# Patient Record
Sex: Female | Born: 2005 | Race: Black or African American | Hispanic: No | Marital: Single
Health system: Midwestern US, Community
[De-identification: ages and names within clinical notes are randomized; demographics above are authoritative.]

## PROBLEM LIST (undated history)

## (undated) ENCOUNTER — Inpatient Hospital Stay (HOSPITAL_COMMUNITY): Payer: Self-pay

## (undated) ENCOUNTER — Emergency Department (HOSPITAL_COMMUNITY): Admission: EM

## (undated) DIAGNOSIS — L309 Dermatitis, unspecified: Secondary | ICD-10-CM

---
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
## 2024-01-02 DIAGNOSIS — Z369 Encounter for antenatal screening, unspecified: Secondary | ICD-10-CM | POA: Diagnosis not present

## 2024-01-02 DIAGNOSIS — Z3490 Encounter for supervision of normal pregnancy, unspecified, unspecified trimester: Secondary | ICD-10-CM | POA: Diagnosis not present

## 2024-01-02 DIAGNOSIS — Z3481 Encounter for supervision of other normal pregnancy, first trimester: Secondary | ICD-10-CM | POA: Diagnosis not present

## 2024-01-02 DIAGNOSIS — N898 Other specified noninflammatory disorders of vagina: Secondary | ICD-10-CM | POA: Diagnosis not present

## 2024-01-02 DIAGNOSIS — Z3A12 12 weeks gestation of pregnancy: Secondary | ICD-10-CM | POA: Diagnosis not present

## 2024-01-02 DIAGNOSIS — Z113 Encounter for screening for infections with a predominantly sexual mode of transmission: Secondary | ICD-10-CM | POA: Diagnosis not present

## 2024-01-02 DIAGNOSIS — Z3480 Encounter for supervision of other normal pregnancy, unspecified trimester: Secondary | ICD-10-CM | POA: Diagnosis not present

## 2024-01-02 LAB — OB RESULTS CONSOLE RUBELLA ANTIBODY, IGM: Rubella: IMMUNE

## 2024-01-28 ENCOUNTER — Inpatient Hospital Stay (HOSPITAL_COMMUNITY)
Admission: AD | Admit: 2024-01-28 | Discharge: 2024-01-28 | Disposition: A | Discharge status: Home / Self Care | Attending: Obstetrics and Gynecology | Admitting: Obstetrics and Gynecology

## 2024-01-28 ENCOUNTER — Encounter (HOSPITAL_COMMUNITY): Payer: Self-pay | Admitting: Obstetrics and Gynecology

## 2024-01-28 DIAGNOSIS — X58XXXA Exposure to other specified factors, initial encounter: Secondary | ICD-10-CM | POA: Insufficient documentation

## 2024-01-28 DIAGNOSIS — O9A212 Injury, poisoning and certain other consequences of external causes complicating pregnancy, second trimester: Secondary | ICD-10-CM

## 2024-01-28 DIAGNOSIS — S3991XA Unspecified injury of abdomen, initial encounter: Secondary | ICD-10-CM | POA: Diagnosis not present

## 2024-01-28 DIAGNOSIS — Z3A16 16 weeks gestation of pregnancy: Secondary | ICD-10-CM | POA: Diagnosis not present

## 2024-01-28 DIAGNOSIS — W228XXA Striking against or struck by other objects, initial encounter: Secondary | ICD-10-CM | POA: Diagnosis not present

## 2024-01-28 NOTE — MAU Note (Signed)
 Tami Hall is a 18 y.o. at [redacted]w[redacted]d here in MAU reporting: hit her stomach on a machine at work . Had some pain when it first happened ( ago) but not now. Denies any vag bleefding or discharge. Stated she has a headache as well   LMP:  Onset of complaint: 45 min Pain score: 3 headache Vitals:   01/28/24 1935  BP: (!) 104/55  Pulse: 89  Resp: 18  Temp: 98.2 F (36.8 C)     FHT: 152   Lab orders placed from triage:

## 2024-01-28 NOTE — Discharge Instructions (Signed)
 Reasons to return to MAU at [facility]: If you have heavier bleeding that soaks through more that 2 pads per hour for an hour or more If you bleed so much that you feel like you might pass out or you do pass out If you have significant abdominal pain that is not improved with Tylenol  1000 mg every 8 hours as needed for pain If you develop a fever > 100.5

## 2024-01-28 NOTE — MAU Provider Note (Signed)
 None     [name] is a 18 y.o. G1P0 female at [redacted]w[redacted]d who presents to MAU today reporting that she hit her stomach at work. 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 She had some pain when it first happened but none now. No bruising or open wound. Denies vaginal bleeding or cramping.   Pertinent items noted in HPI and remainder of comprehensive ROS otherwise negative.   O BP (!) 104/55   Pulse 89   Temp 98.2 F (36.8 C)   Resp 18   Ht 5' 3 (1.6 m)   Wt 61.7 kg   LMP 10/06/2023   BMI 24.09 kg/m  Physical Exam Vitals reviewed.  Constitutional:      General: She is not in acute distress.    Appearance: She is well-developed. She is not diaphoretic.  Eyes:     General: No scleral icterus. Pulmonary:     Effort: Pulmonary effort is normal. No respiratory distress.  Abdominal:     Palpations: Abdomen is soft.     Tenderness: There is no abdominal tenderness.  Skin:    General: Skin is warm and dry.  Neurological:     Mental Status: She is alert.     Coordination: Coordination normal.    No results found for this or any previous visit (from the past 24 hours).  MDM: Low MAU Course: -Patient has low BP as baseline. -FHR within normal limits.  A 1. Abdominal trauma, initial encounter (Primary) - Discharge patient  2. [redacted] weeks gestation of pregnancy - Discharge patient   Medical screening exam complete  P Discharge from MAU in stable condition with bleeding precautions Follow up at [facility] as scheduled for ongoing prenatal care  No future appointments. Allergies as of 01/28/2024   No Known Allergies      Medication List     TAKE these medications    hydrocortisone  2.5 % ointment Apply topically 2 (two) times daily. Apply to vaginal area for itching for 3 days only.   loratadine  5 MG/5ML syrup Commonly known as: Claritin  Take 5 mLs (5 mg total) by mouth daily.   ondansetron  4 MG  disintegrating tablet Commonly known as: ZOFRAN -ODT Take 1 tablet (4 mg total) by mouth every 8 (eight) hours as needed for nausea or vomiting.   prenatal vitamin w/FE, FA 27-1 MG Tabs tablet Take 1 tablet by mouth daily at 12 noon.   triamcinolone  0.025 % ointment Commonly known as: KENALOG  Apply topically 2 (two) times daily. Apply bid x 7 days        [name], PA

## 2024-02-15 ENCOUNTER — Telehealth: Payer: Self-pay

## 2024-02-15 DIAGNOSIS — R109 Unspecified abdominal pain: Secondary | ICD-10-CM

## 2024-02-15 NOTE — Progress Notes (Signed)
 Complex Care Management Note Care Guide Note  02/15/2024 Name: Tami Hall MRN: [id number] DOB: 02-08-2006   Complex Care Management Outreach Attempts: An unsuccessful telephone outreach was attempted today to offer the patient information about available complex care management services.  Follow Up Plan:  Additional outreach attempts will be made to offer the patient complex care management information and services.   Encounter Outcome:  No Answer  [name]  [facility], [facility] VBCI Assistant Direct Dial: [phone]  Fax: [phone]

## 2024-02-19 NOTE — Progress Notes (Signed)
 Complex Care Management Note  Care Guide Note 02/19/2024 Name: KENNA SEWARD MRN: [id number] DOB: [birth year]  Theadore GORMAN Metro is a 18 y.o. year old female who sees Howell Lunger, [place] for primary care. I reached out to Theadore GORMAN Metro by phone today to offer complex care management services.  Ms. Vaughn was given information about Complex Care Management services today including:   The Complex Care Management services include support from the care team which includes your Nurse Care Manager, Clinical Social Worker, or Pharmacist.  The Complex Care Management team is here to help remove barriers to the health concerns and goals most important to you. Complex Care Management services are voluntary, and the patient may decline or stop services at any time by request to their care team member.   Complex Care Management Consent Status: Patient did not agree to participate in complex care management services at this time.  Encounter Outcome:  Patient Refused  Dreama Agent [facility], [facility] VBCI Assistant Direct Dial: [phone]  Fax: [phone]

## 2024-02-19 NOTE — Progress Notes (Signed)
 Complex Care Management Note Care Guide Note  02/19/2024 Name: Tami Hall MRN: [id number] DOB: 18-Sep-2005   Complex Care Management Outreach Attempts: A second unsuccessful outreach was attempted today to offer the patient with information about available complex care management services.  Follow Up Plan:  Additional outreach attempts will be made to offer the patient complex care management information and services.   Encounter Outcome:  No Answer  [name] [facility]  [facility], [facility] VBCI Assistant Direct Dial: [phone]  Fax: [phone]

## 2024-02-26 DIAGNOSIS — Z3A20 20 weeks gestation of pregnancy: Secondary | ICD-10-CM | POA: Diagnosis not present

## 2024-02-26 DIAGNOSIS — Z148 Genetic carrier of other disease: Secondary | ICD-10-CM | POA: Diagnosis not present

## 2024-02-26 DIAGNOSIS — Z363 Encounter for antenatal screening for malformations: Secondary | ICD-10-CM | POA: Diagnosis not present

## 2024-03-20 ENCOUNTER — Other Ambulatory Visit: Payer: Self-pay

## 2024-03-20 ENCOUNTER — Emergency Department (HOSPITAL_BASED_OUTPATIENT_CLINIC_OR_DEPARTMENT_OTHER)

## 2024-03-20 ENCOUNTER — Emergency Department (HOSPITAL_BASED_OUTPATIENT_CLINIC_OR_DEPARTMENT_OTHER)
Admission: EM | Admit: 2024-03-20 | Discharge: 2024-03-21 | Disposition: A | Discharge status: Home / Self Care | Attending: Emergency Medicine | Admitting: Emergency Medicine

## 2024-03-20 DIAGNOSIS — Z3A23 23 weeks gestation of pregnancy: Secondary | ICD-10-CM | POA: Diagnosis not present

## 2024-03-20 DIAGNOSIS — O4690 Antepartum hemorrhage, unspecified, unspecified trimester: Secondary | ICD-10-CM

## 2024-03-20 DIAGNOSIS — O4692 Antepartum hemorrhage, unspecified, second trimester: Secondary | ICD-10-CM | POA: Insufficient documentation

## 2024-03-20 DIAGNOSIS — N939 Abnormal uterine and vaginal bleeding, unspecified: Secondary | ICD-10-CM | POA: Insufficient documentation

## 2024-03-20 LAB — CBC WITH DIFFERENTIAL/PLATELET
Abs Immature Granulocytes: 0.02 K/uL (ref 0.00–0.07)
Basophils Absolute: 0 K/uL (ref 0.0–0.1)
Basophils Relative: 0 %
Eosinophils Absolute: 0.1 K/uL (ref 0.0–0.5)
Eosinophils Relative: 1 %
HCT: 30 % — ABNORMAL LOW (ref 36.0–46.0)
Hemoglobin: 11 g/dL — ABNORMAL LOW (ref 12.0–15.0)
Immature Granulocytes: 0 %
Lymphocytes Relative: 24 %
Lymphs Abs: 2 K/uL (ref 0.7–4.0)
MCH: 31.2 pg (ref 26.0–34.0)
MCHC: 36.7 g/dL — ABNORMAL HIGH (ref 30.0–36.0)
MCV: 85 fL (ref 80.0–100.0)
Monocytes Absolute: 0.4 K/uL (ref 0.1–1.0)
Monocytes Relative: 5 %
Neutro Abs: 5.9 K/uL (ref 1.7–7.7)
Neutrophils Relative %: 70 %
Platelets: 388 K/uL (ref 150–400)
RBC: 3.53 MIL/uL — ABNORMAL LOW (ref 3.87–5.11)
RDW: 12.2 % (ref 11.5–15.5)
WBC: 8.5 K/uL (ref 4.0–10.5)
nRBC: 0 % (ref 0.0–0.2)

## 2024-03-20 NOTE — ED Triage Notes (Signed)
 Pt states that she has baan having vaginal bleeding (spotting) for the past 2-3 days. Pt is [redacted] weeks pregnant, G1P0, mother reports normal movement. Pt also report vaginal irritation after intercourse, pt denies abd pain.

## 2024-03-20 NOTE — ED Notes (Addendum)
 Vertell with rapid OB response notified. Pt placed on External fetal monitor. Fetal Heart rate 154. Denies any abd pain.

## 2024-03-21 LAB — WET PREP, GENITAL
Clue Cells Wet Prep HPF POC: NONE SEEN
Sperm: NONE SEEN
Trich, Wet Prep: NONE SEEN
WBC, Wet Prep HPF POC: <10 (ref <10)
Yeast Wet Prep HPF POC: NONE SEEN

## 2024-03-21 LAB — URINALYSIS, ROUTINE W REFLEX MICROSCOPIC
Bilirubin Urine: NEGATIVE
Glucose, UA: NEGATIVE mg/dL
Hgb urine dipstick: NEGATIVE
Ketones, ur: NEGATIVE mg/dL
Nitrite: NEGATIVE
Protein, ur: NEGATIVE mg/dL
Specific Gravity, Urine: 1.016 (ref 1.005–1.030)
pH: 7 (ref 5.0–8.0)

## 2024-03-21 LAB — RH IG WORKUP (INCLUDES ABO/RH)
ABO/RH(D): A POS
Gestational Age(Wks): 23

## 2024-03-21 LAB — HIV ANTIBODY (ROUTINE TESTING W REFLEX): HIV Screen 4th Generation wRfx: NONREACTIVE

## 2024-03-21 LAB — ABO/RH: ABO/RH(D): A POS

## 2024-03-21 NOTE — Progress Notes (Signed)
 Spoke with ED RN. Informed her that Dr MARLA Bring has approved the pt being discharged home.

## 2024-03-21 NOTE — Progress Notes (Signed)
 Spoke with Dr MARLA Bring regarding pt who is a G1P0 at [redacted]w[redacted]d has presented to [facility] ED with c/o vaginal bleeding/spotting x2-3days. +FM, -leaking fluid, -cramping/contrx. FHR 140s, mod variability, appropriate tracing for 23w. ED MD performed speculum exam and saw no bleeding from cervix or in vaginal canal. Pt has not established care at this time, has come to MAU several times. Dr Bring is ok with pt being d/c home at this time.

## 2024-03-21 NOTE — ED Notes (Signed)
 Leaving before receiving DC papers. Refuses vitals. IV removed. Seen leaving to lobby with mother.

## 2024-03-21 NOTE — Discharge Instructions (Signed)
 You were seen today for bleeding during pregnancy.  Given that this is happening after sex and you were told to have bed rest, would avoid sexual contact until cleared by your OB/GYN.  Otherwise your workup was reassuring.

## 2024-03-21 NOTE — ED Provider Notes (Signed)
 [place] EMERGENCY DEPARTMENT AT [facility] Provider Note   CSN: [id number] Arrival date & time: 03/20/24  2004     Patient presents with: Vaginal Bleeding   Tami Hall is a 18 y.o. female.   HPI     This a 18 year old G1, P0 approximately 23 weeks and 5 days pregnant who presents with vaginal spotting.  Patient reports she has had 2 episodes of vaginal spotting in the last 2 to 3 days.  At least one of the episodes was after having sex.  She reports that she is supposed to be on bedrest but was not told that she should not have sex.  She is not reporting any abdominal pain or contractions.  No urinary symptoms but does report some vaginal irritation.  Has not had any complications during this pregnancy.  Prior to Admission medications   Medication Sig Start Date End Date Taking? Authorizing Provider  hydrocortisone  2.5 % ointment Apply topically 2 (two) times daily. Apply to vaginal area for itching for 3 days only. 01/09/19   Shirley, [place], DO  loratadine  (CLARITIN ) 5 MG/5ML syrup Take 5 mLs (5 mg total) by mouth daily. Patient not taking: Reported on 01/28/2024 05/11/11   [place], Sarah T, MD  ondansetron  (ZOFRAN -ODT) 4 MG disintegrating tablet Take 1 tablet (4 mg total) by mouth every 8 (eight) hours as needed for nausea or vomiting. Patient not taking: Reported on 01/28/2024 11/21/23   Jhonny Augustin BROCKS, MD  prenatal vitamin w/FE, FA (PRENATAL 1 + 1) 27-1 MG TABS tablet Take 1 tablet by mouth daily at 12 noon. 11/21/23   Jhonny Augustin BROCKS, MD  triamcinolone  (KENALOG ) 0.025 % ointment Apply topically 2 (two) times daily. Apply bid x 7 days 03/07/12   Penne Noe, MD    Allergies: Patient has no known allergies.    Review of Systems  Constitutional:  Negative for fever.  Gastrointestinal:  Negative for abdominal pain.  Genitourinary:  Positive for vaginal bleeding.  All other systems reviewed and are negative.   Updated Vital Signs BP 120/72   Pulse 80   Temp 98.3  F (36.8 C) (Oral)   Resp 18   LMP 10/06/2023   SpO2 100%   Physical Exam Vitals and nursing note reviewed. Exam conducted with a chaperone present.  Constitutional:      Appearance: She is well-developed.  HENT:     Head: Normocephalic and atraumatic.  Eyes:     Pupils: Pupils are equal, round, and reactive to light.  Cardiovascular:     Rate and Rhythm: Normal rate and regular rhythm.     Heart sounds: Normal heart sounds.  Pulmonary:     Effort: Pulmonary effort is normal. No respiratory distress.     Breath sounds: No wheezing.  Abdominal:     General: Bowel sounds are normal.     Palpations: Abdomen is soft.     Tenderness: There is no abdominal tenderness. There is no guarding or rebound.     Comments: Gravid  Genitourinary:    Comments: Normal external vaginal exam, moderate vaginal discharge noted, no active bleeding, cervical os closed Musculoskeletal:     Cervical back: Neck supple.  Skin:    General: Skin is warm and dry.  Neurological:     Mental Status: She is alert and oriented to person, place, and time.  Psychiatric:        Mood and Affect: Mood normal.     (all labs ordered are listed, but only abnormal results  are displayed) Labs Reviewed  CBC WITH DIFFERENTIAL/PLATELET - Abnormal; Notable for the following components:      Result Value   RBC 3.53 (*)    Hemoglobin 11.0 (*)    HCT 30.0 (*)    MCHC 36.7 (*)    All other components within normal limits  URINALYSIS, ROUTINE W REFLEX MICROSCOPIC - Abnormal; Notable for the following components:   APPearance HAZY (*)    Leukocytes,Ua SMALL (*)    Bacteria, UA RARE (*)    All other components within normal limits  WET PREP, GENITAL  HIV ANTIBODY (ROUTINE TESTING W REFLEX)  ABO/RH  RH IG WORKUP (INCLUDES ABO/RH)  GC/CHLAMYDIA PROBE AMP ([place]) NOT AT [facility]    EKG: None  Radiology: US  OB Limited > 14 wks Result Date: 03/20/2024 EXAM: ULTRASOND OB LIMITED CLINICAL HISTORY: vaginal  bleedinhg. TECHNIQUE: Transabdominal/Transvaginal obstetric pelvic ultrasound was performed with color Doppler flow evaluation. COMPARISON: None . FINDINGS: FETUS: A single live intrauterine pregnancy is present, estimated gestational age [redacted] weeks and 1 day which is based only on a femur length measurement. Fetal movement is confirmed. POSITION: Presentation is cephalic. HEART RATE: Fetal heart rate measures 138  beats per minute. PLACENTA: The placenta is located posteriorly without evidence of previa. There is no retroplacental hematoma. AMNIOTIC FLUID: Amniotic fluid volume appears visually normal, but the technologist did not take measurements. Amniotic fluid index not obtained. BPD was not measured. Head circumference was not measured. Abdominal circumference was not measured. Femur length measures 4.57 cm. IMPRESSION: 1. Single live intrauterine pregnancy with gestational age of [redacted] weeks and 1 day by femur length single measurement. By comparison, the lmp ega is 23 weeks 5 days, lmp edc 07/12/2024 . 2. Cephalic presentation. 3. Posterior placenta without evidence of previa or retroplacental hematoma. 4. Amniotic fluid volume appears visually normal; measurements were not obtained. Limited exam. . Electronically signed by: Francis Quam MD 03/20/2024 10:15 PM EDT RP Workstation: [id number]     Procedures   Medications Ordered in the ED - No data to display                                  Medical Decision Making Amount and/or Complexity of Data Reviewed Labs: ordered.   This patient presents to the ED for concern of vaginal bleeding during pregnancy, this involves an extensive number of treatment options, and is a complaint that carries with it a high risk of complications and morbidity.  I considered the following differential and admission for this acute, potentially life threatening condition.  The differential diagnosis includes bleeding secondary to intercourse, subchorionic hemorrhage,  placental abruption, miscarriage, placenta previa  MDM:    This is a 18 year old female who presents with vaginal bleeding.  At least 1 of these episodes was after intercourse.  She is not actively bleeding at this time.  Ultrasound is reassuring without previa.  Good fetal heart tones and no active contractions on monitoring.  Pelvic exam is reassuring without any active bleeding.  Urinalysis and wet prep are also reassuring.  Advised patient that if she was advised by her OB/GYN to have bed rest, she should not be engaging in sexual activity until cleared by her OB/GYN.  She stated understanding.  Patient is A+ and does not need RhoGAM.  (Labs, imaging, consults)  Labs: I Ordered, and personally interpreted labs.  The pertinent results include: CBC, Rh, urinalysis, wet prep  Imaging Studies  ordered: I ordered imaging studies including ultrasound I independently visualized and interpreted imaging. I agree with the radiologist interpretation  Additional history obtained from chart review.  External records from outside source obtained and reviewed including prior evaluations  Cardiac Monitoring: The patient was maintained on a cardiac monitor.  If on the cardiac monitor, I personally viewed and interpreted the cardiac monitored which showed an underlying rhythm of: Sinus  Reevaluation: After the interventions noted above, I reevaluated the patient and found that they have :improved  Social Determinants of Health:  lives independently  Disposition: Discharge  Co morbidities that complicate the patient evaluation  Past Medical History:  Diagnosis Date   Eczema      Medicines No orders of the defined types were placed in this encounter.   I have reviewed the patients home medicines and have made adjustments as needed  Problem List / ED Course: Problem List Items Addressed This Visit   None Visit Diagnoses       Vaginal bleeding during pregnancy    -  Primary                 Final diagnoses:  Vaginal bleeding during pregnancy    ED Discharge Orders     None          Bari Charmaine FALCON, MD 03/21/24 [phone]

## 2024-03-24 LAB — GC/CHLAMYDIA PROBE AMP (~~LOC~~) NOT AT ARMC
Chlamydia: NEGATIVE
Comment: NEGATIVE
Comment: NORMAL
Neisseria Gonorrhea: NEGATIVE

## 2024-04-14 ENCOUNTER — Inpatient Hospital Stay (HOSPITAL_COMMUNITY)
Admission: AD | Admit: 2024-04-14 | Discharge: 2024-04-14 | Disposition: A | Discharge status: Home / Self Care | Attending: Family Medicine | Admitting: Family Medicine

## 2024-04-14 ENCOUNTER — Encounter (HOSPITAL_COMMUNITY): Payer: Self-pay | Admitting: Obstetrics and Gynecology

## 2024-04-14 DIAGNOSIS — Z202 Contact with and (suspected) exposure to infections with a predominantly sexual mode of transmission: Secondary | ICD-10-CM | POA: Diagnosis not present

## 2024-04-14 DIAGNOSIS — N898 Other specified noninflammatory disorders of vagina: Secondary | ICD-10-CM | POA: Diagnosis not present

## 2024-04-14 DIAGNOSIS — Z3A27 27 weeks gestation of pregnancy: Secondary | ICD-10-CM

## 2024-04-14 DIAGNOSIS — Z113 Encounter for screening for infections with a predominantly sexual mode of transmission: Secondary | ICD-10-CM | POA: Diagnosis present

## 2024-04-14 DIAGNOSIS — L292 Pruritus vulvae: Secondary | ICD-10-CM | POA: Diagnosis not present

## 2024-04-14 DIAGNOSIS — O26892 Other specified pregnancy related conditions, second trimester: Secondary | ICD-10-CM | POA: Diagnosis not present

## 2024-04-14 LAB — URINALYSIS, ROUTINE W REFLEX MICROSCOPIC
Bilirubin Urine: NEGATIVE
Glucose, UA: NEGATIVE mg/dL
Hgb urine dipstick: NEGATIVE
Ketones, ur: 5 mg/dL — AB
Nitrite: NEGATIVE
Protein, ur: NEGATIVE mg/dL
Specific Gravity, Urine: 1.019 (ref 1.005–1.030)
pH: 7 (ref 5.0–8.0)

## 2024-04-14 LAB — WET PREP, GENITAL
Clue Cells Wet Prep HPF POC: NONE SEEN
Sperm: NONE SEEN
Trich, Wet Prep: NONE SEEN
WBC, Wet Prep HPF POC: <10 (ref <10)

## 2024-04-14 LAB — HEPATITIS B SURFACE ANTIGEN: Hepatitis B Surface Ag: NONREACTIVE

## 2024-04-14 LAB — RAPID HIV SCREEN (HIV 1/2 AB+AG)
HIV 1/2 Antibodies: NONREACTIVE
HIV-1 P24 Antigen - HIV24: NONREACTIVE

## 2024-04-14 NOTE — MAU Provider Note (Signed)
 History     [id number]  Arrival date and time: 04/14/24 1533    Chief Complaint  Patient presents with   Vaginal Itching     HPI Tami Hall is a 18 y.o. at [redacted]w[redacted]d by LMP, who presents for vaginal itching.   Reports vaginal itching that started last night Having some whitish discharge No burning or pain with urination No vaginal bleeding Fetal movement is normal No contractions No leaking fluid Found out partner has been with someone else Wants to get STI testing No rash on labia that she can see  Going to [facility], has had one visit so far   --/--/A POS, A POS (10/16 2042)  OB History     Gravida  1   Para      Term      Preterm      AB      Living         SAB      IAB      Ectopic      Multiple      Live Births              Past Medical History:  Diagnosis Date   Eczema     Past Surgical History:  Procedure Laterality Date   MOUTH SURGERY     dental    Family History  Problem Relation Age of Onset   Healthy Mother    Healthy Father     Social History   Socioeconomic History   Marital status: Single    Spouse name: Not on file   Number of children: Not on file   Years of education: Not on file   Highest education level: Not on file  Occupational History   Not on file  Tobacco Use   Smoking status: Never   Smokeless tobacco: Never   Tobacco comments:    Stopped vaping end of May  Vaping Use   Vaping status: Former  Substance and Sexual Activity   Alcohol use: Not Currently   Drug use: Not Currently    Frequency: 7.0 times per week    Types: Marijuana    Comment: last 5/31   Sexual activity: Yes  Other Topics Concern   Not on file  Social History Narrative   Not on file   Social Drivers of Health   Financial Resource Strain: Not on file  Food Insecurity: Not on file  Transportation Needs: Not on file  Physical Activity: Not on file  Stress: Not on file  Social Connections: Not on file  Intimate  Partner Violence: Not on file    No Known Allergies  No current facility-administered medications on file prior to encounter.   Current Outpatient Medications on File Prior to Encounter  Medication Sig Dispense Refill   prenatal vitamin w/FE, FA (PRENATAL 1 + 1) 27-1 MG TABS tablet Take 1 tablet by mouth daily at 12 noon. 30 tablet 0   hydrocortisone  2.5 % ointment Apply topically 2 (two) times daily. Apply to vaginal area for itching for 3 days only. 20 g 0   loratadine  (CLARITIN ) 5 MG/5ML syrup Take 5 mLs (5 mg total) by mouth daily. (Patient not taking: Reported on 01/28/2024) 236 mL 2   ondansetron  (ZOFRAN -ODT) 4 MG disintegrating tablet Take 1 tablet (4 mg total) by mouth every 8 (eight) hours as needed for nausea or vomiting. (Patient not taking: Reported on 01/28/2024) 15 tablet 0   triamcinolone  (KENALOG ) 0.025 % ointment Apply topically 2 (  two) times daily. Apply bid x 7 days 30 g 0     ROS Pertinent positives and negative per HPI, all others reviewed and negative  Physical Exam   BP (!) 118/56 (BP Location: Right Arm)   Pulse 89   Temp 98.4 F (36.9 C) (Oral)   Resp 15   Ht 5' 2 (1.575 m)   Wt 68.4 kg   LMP 10/06/2023   SpO2 99%   BMI 27.58 kg/m   Patient Vitals for the past 24 hrs:  BP Temp Temp src Pulse Resp SpO2 Height Weight  04/14/24 1605 -- -- -- -- -- 99 % -- --  04/14/24 1604 (!) 118/56 98.4 F (36.9 C) Oral 89 15 99 % 5' 2 (1.575 m) 68.4 kg    Physical Exam Vitals reviewed.  Constitutional:      General: She is not in acute distress.    Appearance: She is well-developed. She is not diaphoretic.  Eyes:     General: No scleral icterus. Pulmonary:     Effort: Pulmonary effort is normal. No respiratory distress.  Skin:    General: Skin is warm and dry.  Neurological:     Mental Status: She is alert.     Coordination: Coordination normal.      Cervical Exam    Bedside Ultrasound Not performed.  My interpretation: n/a  FHT Baseline: 140  bpm Variability: Good {> 6 bpm) Accelerations: Reactive (10x10, appropriate for gestational age) Decelerations: Absent Uterine activity: None Cat: I  Labs Results for orders placed or performed during the hospital encounter of 04/14/24 (from the past 24 hours)  Urinalysis, Routine w reflex microscopic -Urine, Clean Catch     Status: Abnormal   Collection Time: 04/14/24  4:00 PM  Result Value Ref Range   Color, Urine YELLOW YELLOW   APPearance CLOUDY (A) CLEAR   Specific Gravity, Urine 1.019 1.005 - 1.030   pH 7.0 5.0 - 8.0   Glucose, UA NEGATIVE NEGATIVE mg/dL   Hgb urine dipstick NEGATIVE NEGATIVE   Bilirubin Urine NEGATIVE NEGATIVE   Ketones, ur 5 (A) NEGATIVE mg/dL   Protein, ur NEGATIVE NEGATIVE mg/dL   Nitrite NEGATIVE NEGATIVE   Leukocytes,Ua MODERATE (A) NEGATIVE   RBC / HPF 6-10 0 - 5 RBC/hpf   WBC, UA 6-10 0 - 5 WBC/hpf   Bacteria, UA RARE (A) NONE SEEN   Squamous Epithelial / HPF 21-50 0 - 5 /HPF   Mucus PRESENT     Imaging No results found.  MAU Course  Procedures Lab Orders         Wet prep, genital         Urinalysis, Routine w reflex microscopic -Urine, Clean Catch         Rapid HIV screen (HIV 1/2 Ab+Ag)         RPR         Hepatitis B surface antigen         HCV Ab w Reflex to Quant PCR    No orders of the defined types were placed in this encounter.  Imaging Orders  No imaging studies ordered today    MDM Moderate (Level 3-4)  Assessment and Plan  #Vaginal itching #Concern for STI exposure Patient without any other concerns. Offered to collect swabs to rule out HSV, prefers to do so herself and have results communicated to her by MyChart. Swabs and serologies collected.   #FWB FHT Cat I NST: Reactive   Dispo: discharged to home in stable  condition    Donnice CHRISTELLA Carolus, MD/MPH 04/14/24 4:51 PM  Allergies as of 04/14/2024   No Known Allergies      Medication List     TAKE these medications    hydrocortisone  2.5 %  ointment Apply topically 2 (two) times daily. Apply to vaginal area for itching for 3 days only.   loratadine  5 MG/5ML syrup Commonly known as: Claritin  Take 5 mLs (5 mg total) by mouth daily.   ondansetron  4 MG disintegrating tablet Commonly known as: ZOFRAN -ODT Take 1 tablet (4 mg total) by mouth every 8 (eight) hours as needed for nausea or vomiting.   prenatal vitamin w/FE, FA 27-1 MG Tabs tablet Take 1 tablet by mouth daily at 12 noon.   triamcinolone  0.025 % ointment Commonly known as: KENALOG  Apply topically 2 (two) times daily. Apply bid x 7 days

## 2024-04-14 NOTE — MAU Note (Signed)
 Tami Hall is a 18 y.o. at [redacted]w[redacted]d here in MAU reporting: vaginal itching and burning since last pm. Reports she had intercourse with the baby's daddy on Friday and found out yesterday he was having sex with someone else. Also reports a white discharge. Denies pain, bleeding or ROM. Reports positive fetal movement.   Onset of complaint: last night Pain score: 0 Vitals:   04/14/24 1604 04/14/24 1605  BP: (!) 118/56   Pulse: 89   Resp: 15   Temp: 98.4 F (36.9 C)   SpO2: 99% 99%     FHT: 142  Lab orders placed from triage: ua

## 2024-04-14 NOTE — Discharge Instructions (Signed)
 You were seen in the MAU for vaginal itching. We did swabs and blood work, and per your preference we will let you know the results through MyChart and send you medicine if needed.

## 2024-04-15 LAB — RPR: RPR Ser Ql: NONREACTIVE

## 2024-04-16 ENCOUNTER — Encounter: Payer: Self-pay | Admitting: Student

## 2024-04-16 ENCOUNTER — Ambulatory Visit: Payer: Self-pay | Admitting: Family Medicine

## 2024-04-16 LAB — HCV AB W REFLEX TO QUANT PCR: HCV Ab: NONREACTIVE

## 2024-04-16 LAB — HCV INTERPRETATION

## 2024-04-16 MED ORDER — FLUCONAZOLE 150 MG PO TABS: 150.0000 mg | ORAL_TABLET | Freq: Once | ORAL | 0 refills | Status: AC

## 2024-04-27 ENCOUNTER — Encounter (HOSPITAL_COMMUNITY): Payer: Self-pay | Admitting: Obstetrics and Gynecology

## 2024-04-27 ENCOUNTER — Inpatient Hospital Stay (HOSPITAL_COMMUNITY)
Admission: AD | Admit: 2024-04-27 | Discharge: 2024-04-27 | Disposition: A | Discharge status: Home / Self Care | Attending: Obstetrics and Gynecology | Admitting: Obstetrics and Gynecology

## 2024-04-27 DIAGNOSIS — B3731 Acute candidiasis of vulva and vagina: Secondary | ICD-10-CM | POA: Diagnosis not present

## 2024-04-27 DIAGNOSIS — O23599 Infection of other part of genital tract in pregnancy, unspecified trimester: Secondary | ICD-10-CM

## 2024-04-27 DIAGNOSIS — Z3A29 29 weeks gestation of pregnancy: Secondary | ICD-10-CM | POA: Insufficient documentation

## 2024-04-27 DIAGNOSIS — O98813 Other maternal infectious and parasitic diseases complicating pregnancy, third trimester: Secondary | ICD-10-CM | POA: Insufficient documentation

## 2024-04-27 DIAGNOSIS — R300 Dysuria: Secondary | ICD-10-CM | POA: Insufficient documentation

## 2024-04-27 DIAGNOSIS — O26893 Other specified pregnancy related conditions, third trimester: Secondary | ICD-10-CM | POA: Insufficient documentation

## 2024-04-27 DIAGNOSIS — O23593 Infection of other part of genital tract in pregnancy, third trimester: Secondary | ICD-10-CM | POA: Diagnosis not present

## 2024-04-27 LAB — URINALYSIS, ROUTINE W REFLEX MICROSCOPIC
Bacteria, UA: NONE SEEN
Bilirubin Urine: NEGATIVE
Glucose, UA: 50 mg/dL — AB
Hgb urine dipstick: NEGATIVE
Ketones, ur: NEGATIVE mg/dL
Leukocytes,Ua: NEGATIVE
Nitrite: NEGATIVE
Protein, ur: 30 mg/dL — AB
Specific Gravity, Urine: 1.02 (ref 1.005–1.030)
pH: 7 (ref 5.0–8.0)

## 2024-04-27 LAB — WET PREP, GENITAL
Clue Cells Wet Prep HPF POC: NONE SEEN
Sperm: NONE SEEN
Trich, Wet Prep: NONE SEEN
WBC, Wet Prep HPF POC: >=10 — AB (ref <10)
Yeast Wet Prep HPF POC: NONE SEEN

## 2024-04-27 MED ORDER — TERCONAZOLE 0.8 % VA CREA: 1.0000 | TOPICAL_CREAM | Freq: Every day | VAGINAL | 0 refills | Status: AC

## 2024-04-27 NOTE — MAU Provider Note (Signed)
 Chief Complaint:  vaginal discomfort   HPI   None     Tami Hall is a 18 y.o. G1P0 at [redacted]w[redacted]d who presents to maternity admissions reporting vaginal discomfort. She reports that she finished a 3 day course of vaginal cream for a yeast infection but thinks that the infection did not resolve. She reports dysuria, foul smelling discharge, thick white vaginal discharge. She also reports pelvic pressure earlier today that has since resolved. Last intercourse was yesterday. Denies vaginal bleeding, leaking of fluid, contractions. Endorses good fetal movement.   Pregnancy Course: Receives care at [facility]. Prenatal records reviewed.   Past Medical History:  Diagnosis Date   Eczema    OB History  Gravida Para Term Preterm AB Living  1       SAB IAB Ectopic Multiple Live Births          # Outcome Date GA Lbr Len/2nd Weight Sex Type Anes PTL Lv  1 Current            Past Surgical History:  Procedure Laterality Date   MOUTH SURGERY     dental   Family History  Problem Relation Age of Onset   Healthy Mother    Healthy Father    Social History   Tobacco Use   Smoking status: Never   Smokeless tobacco: Never   Tobacco comments:    Stopped vaping end of May  Vaping Use   Vaping status: Former  Substance Use Topics   Alcohol use: Not Currently   Drug use: Not Currently    Frequency: 7.0 times per week    Types: Marijuana    Comment: last 5/31   No Known Allergies Medications Prior to Admission  Medication Sig Dispense Refill Last Dose/Taking   loratadine  (CLARITIN ) 5 MG/5ML syrup Take 5 mLs (5 mg total) by mouth daily. 236 mL 2 Past Month   ondansetron  (ZOFRAN -ODT) 4 MG disintegrating tablet Take 1 tablet (4 mg total) by mouth every 8 (eight) hours as needed for nausea or vomiting. 15 tablet 0 Past Month   prenatal vitamin w/FE, FA (PRENATAL 1 + 1) 27-1 MG TABS tablet Take 1 tablet by mouth daily at 12 noon. 30 tablet 0 04/26/2024   [DISCONTINUED] terconazole   (TERAZOL 3 ) 0.8 % vaginal cream Place 1 applicator vaginally at bedtime.   Past Month   hydrocortisone  2.5 % ointment Apply topically 2 (two) times daily. Apply to vaginal area for itching for 3 days only. 20 g 0    triamcinolone  (KENALOG ) 0.025 % ointment Apply topically 2 (two) times daily. Apply bid x 7 days 30 g 0     I have reviewed patient's Past Medical Hx, Surgical Hx, Family Hx, Social Hx, medications and allergies.   ROS  Pertinent items noted in HPI and remainder of comprehensive ROS otherwise negative.   PHYSICAL EXAM  Patient Vitals for the past 24 hrs:  BP Temp Temp src Pulse Resp SpO2 Height Weight  04/27/24 1506 -- -- -- -- -- 98 % -- --  04/27/24 1501 111/74 -- -- (!) 103 17 99 % -- --  04/27/24 1453 110/68 98 F (36.7 C) Oral 89 18 -- -- --  04/27/24 1409 115/74 97.8 F (36.6 C) Oral (!) 105 16 98 % 5' 2 (1.575 m) 71.2 kg    Constitutional: Well-developed, well-nourished female in no acute distress.  HEENT: atraumatic, normocephalic. Neck has normal ROM. EOM intact. Cardiovascular: normal rate & rhythm, warm and well-perfused Respiratory: normal effort, no  problems with respiration noted GI: gravid abdomen MSK: Extremities nontender, no edema, normal ROM Skin: warm and dry. Acyanotic, no jaundice or pallor. Neurologic: Alert and oriented x 4. No abnormal coordination. Psychiatric: Normal mood. Speech not slurred, not rapid/pressured. Patient is cooperative. Pelvic exam: deferred  Fetal Tracing: Baseline FHR: 135 per minute Fetal heart variability: moderate Fetal Heart Rate accelerations: yes Fetal Heart Rate decelerations: none Fetal Non-stress Test: Category I (reactive) Toco: uterine irritability   Labs: Results for orders placed or performed during the hospital encounter of 04/27/24 (from the past 24 hours)  Wet prep, genital     Status: Abnormal   Collection Time: 04/27/24  2:42 PM   Specimen: Urine, Clean Catch  Result Value Ref Range   Yeast Wet  Prep HPF POC NONE SEEN NONE SEEN   Trich, Wet Prep NONE SEEN NONE SEEN   Clue Cells Wet Prep HPF POC NONE SEEN NONE SEEN   WBC, Wet Prep HPF POC >=10 (A) <10   Sperm NONE SEEN   Urinalysis, Routine w reflex microscopic -Urine, Clean Catch     Status: Abnormal   Collection Time: 04/27/24  2:42 PM  Result Value Ref Range   Color, Urine YELLOW YELLOW   APPearance HAZY (A) CLEAR   Specific Gravity, Urine 1.020 1.005 - 1.030   pH 7.0 5.0 - 8.0   Glucose, UA 50 (A) NEGATIVE mg/dL   Hgb urine dipstick NEGATIVE NEGATIVE   Bilirubin Urine NEGATIVE NEGATIVE   Ketones, ur NEGATIVE NEGATIVE mg/dL   Protein, ur 30 (A) NEGATIVE mg/dL   Nitrite NEGATIVE NEGATIVE   Leukocytes,Ua NEGATIVE NEGATIVE   RBC / HPF 0-5 0 - 5 RBC/hpf   WBC, UA 0-5 0 - 5 WBC/hpf   Bacteria, UA NONE SEEN NONE SEEN   Squamous Epithelial / HPF 0-5 0 - 5 /HPF   Mucus PRESENT    Amorphous Crystal PRESENT     Imaging:  No results found.  MDM & MAU COURSE  MDM: Moderate  MAU Course: -Vital signs within normal limits. -UA, GC/CT, and wet prep to rule out infection.  -UA negative for infection. -Wet prep negative. Recommend 7 days of terconazole  cream to treat infection based on clinical diagnosis.  Differential diagnosis considered for vaginal discharge includes but is not limited to: bacterial vaginosis, vulvovaginal candidiasis, trichomoniasis, gonorrhea, chlamydia, PID   Orders Placed This Encounter  Procedures   Wet prep, genital   Urinalysis, Routine w reflex microscopic -Urine, Clean Catch   Discharge patient   Meds ordered this encounter  Medications   terconazole  (TERAZOL 3 ) 0.8 % vaginal cream    Sig: Place 1 applicator vaginally at bedtime for 7 days.    Dispense:  20 g    Refill:  0    ASSESSMENT   1. Candidal vulvovaginitis during pregnancy   2. [redacted] weeks gestation of pregnancy     PLAN  Discharge home in stable condition with preterm labor precautions.  Recommend 7 day course of terconazole   cream.     Allergies as of 04/27/2024   No Known Allergies      Medication List     TAKE these medications    hydrocortisone  2.5 % ointment Apply topically 2 (two) times daily. Apply to vaginal area for itching for 3 days only.   loratadine  5 MG/5ML syrup Commonly known as: Claritin  Take 5 mLs (5 mg total) by mouth daily.   ondansetron  4 MG disintegrating tablet Commonly known as: ZOFRAN -ODT Take 1 tablet (4 mg total)  by mouth every 8 (eight) hours as needed for nausea or vomiting.   prenatal vitamin w/FE, FA 27-1 MG Tabs tablet Take 1 tablet by mouth daily at 12 noon.   terconazole  0.8 % vaginal cream Commonly known as: TERAZOL 3  Place 1 applicator vaginally at bedtime for 7 days.   triamcinolone  0.025 % ointment Commonly known as: KENALOG  Apply topically 2 (two) times daily. Apply bid x 7 days        [name], PA

## 2024-04-27 NOTE — Discharge Instructions (Signed)
 YEAST INFECTION (CANDIDIASIS): Use the terconazole  cream nightly at bedtime for 7 nights. Keep the area cool and dry as much as possible. Use gentle nonscented soap when bathing. Coconut oil has some antifungal properties that may offer some relief. Avoid foods that are high in sugar. Eat more yogurt or have a probiotic. Wear natural, breathable fibers such as cotton, silk or linen. Wear loose fitting clothing. If symptoms do not resolve, follow up with your OB.  Reasons to return to [facility]U at [facility]: Less than 36 weeks: Contractions feels like menstrual cramps. You should go to the hospital if you have more than 6 contractions in an hour, even after you have rested and drank at least 16 ounces of water.  More than 36 weeks: You begin to have strong, frequent contractions 5 minutes apart or less, each last 1 minute, these have been going on for 1-2 hours, and you cannot walk or talk during them. Your water breaks.  Sometimes it is a big gush of fluid. However, many times it may it may be much more subtle. You should go to the hospital if you have a constant leakage of fluid from your vagina, enough to soak a pad when you are walking around.  You have vaginal bleeding.  It is normal to have a small amount of spotting if your cervix was checked. If you have bleeding requiring the use of a pad, go to the hospital. You don't feel your baby moving like normal.  If you think that you baby's movement is decreased, eat a snack and rest on your left side in a quiet room for one hour. If you have not felt the baby move more than 6 times in an hour GO TO THE HOSPITAL.

## 2024-04-27 NOTE — MAU Note (Signed)
 Tami Hall is a 18 y.o. at [redacted]w[redacted]d here in MAU reporting: wants to make sure the baby isn't coming today because she is  feeling weird down there  Completed her 3 day course of vaginal cream for the yeast infection, but thinks that the yeast infection didn't go away. States it is burning when she pees, there is foul smelling discharge, and is feeling pelvic pressure. Had intercourse yesterday. White thick vaginal discharge  Denies any CTXs, LOF or VB. Reports +FM  Onset of complaint: ongoing Pain score: 2 Vitals:   04/27/24 1409  BP: 115/74  Pulse: (!) 105  Resp: 16  Temp: 97.8 F (36.6 C)  SpO2: 98%     FHT:151 Lab orders placed from triage:  vag swab, UA

## 2024-05-06 DIAGNOSIS — O09619 Supervision of young primigravida, unspecified trimester: Secondary | ICD-10-CM | POA: Diagnosis not present

## 2024-05-06 DIAGNOSIS — Z364 Encounter for antenatal screening for fetal growth retardation: Secondary | ICD-10-CM | POA: Diagnosis not present

## 2024-05-06 DIAGNOSIS — Z3A30 30 weeks gestation of pregnancy: Secondary | ICD-10-CM | POA: Diagnosis not present

## 2024-05-19 DIAGNOSIS — Z3A32 32 weeks gestation of pregnancy: Secondary | ICD-10-CM | POA: Diagnosis not present

## 2024-05-27 LAB — GC/CHLAMYDIA PROBE AMP (~~LOC~~) NOT AT ARMC
Chlamydia: NEGATIVE
Comment: NEGATIVE
Comment: NORMAL
Neisseria Gonorrhea: NEGATIVE

## 2024-06-05 NOTE — L&D Delivery Note (Signed)
 Operative Delivery Note At 3:59 AM a stunned  female was delivered via Vaginal, Spontaneous.  Presentation: vertex; Position: Right,, Occiput,, Anterior; Station: +5.  Delivery of the head: without difficulty.  Nuchal cord times one and was tight. I was not able to reduce it. Thecord was clamped and cut on the perineum    Shoulder dystocia for 10 seconds reduced with Johnita Rummer.  Body delivered and heart rate low.  Infant suctioned.  NICU called.  CPR started.  Infant was revived with supportive measures by NICU team .    ,   Second maneuver: ,   Third maneuver: ,   Fourth maneuver: ,   Fifth maneuver: ,   Sixth maneuver: ,    Verbal consent: obtained from patient.  APGAR: 0, 6; weight  .   Placenta status: , to pathology .   Cord:  with the following complications: .  Cord pH: 7.33   Anesthesia:   Episiotomy: None Lacerations: 1st degree;Perineal Suture Repair: 2.0 chromic Est. Blood Loss (mL): 213  Mom to postpartum.  Baby to Couplet care / Skin to Skin.  Ilyssa Grennan A Roderic Lammert 07/07/2024, 5:06 AM

## 2024-06-16 LAB — OB RESULTS CONSOLE GBS: GBS: NEGATIVE

## 2024-06-20 ENCOUNTER — Encounter (HOSPITAL_COMMUNITY): Payer: Self-pay | Admitting: Obstetrics and Gynecology

## 2024-06-20 ENCOUNTER — Inpatient Hospital Stay (HOSPITAL_COMMUNITY)
Admission: AD | Admit: 2024-06-20 | Discharge: 2024-06-21 | Disposition: A | Discharge status: Home / Self Care | Attending: Obstetrics and Gynecology | Admitting: Obstetrics and Gynecology

## 2024-06-20 ENCOUNTER — Other Ambulatory Visit: Payer: Self-pay

## 2024-06-20 ENCOUNTER — Inpatient Hospital Stay (HOSPITAL_COMMUNITY)

## 2024-06-20 DIAGNOSIS — O26893 Other specified pregnancy related conditions, third trimester: Secondary | ICD-10-CM | POA: Diagnosis present

## 2024-06-20 DIAGNOSIS — Z3689 Encounter for other specified antenatal screening: Secondary | ICD-10-CM

## 2024-06-20 DIAGNOSIS — O4703 False labor before 37 completed weeks of gestation, third trimester: Secondary | ICD-10-CM | POA: Diagnosis not present

## 2024-06-20 DIAGNOSIS — O368330 Maternal care for abnormalities of the fetal heart rate or rhythm, third trimester, not applicable or unspecified: Secondary | ICD-10-CM | POA: Diagnosis not present

## 2024-06-20 DIAGNOSIS — K668 Other specified disorders of peritoneum: Secondary | ICD-10-CM | POA: Insufficient documentation

## 2024-06-20 DIAGNOSIS — R52 Pain, unspecified: Secondary | ICD-10-CM | POA: Diagnosis present

## 2024-06-20 DIAGNOSIS — Z3A36 36 weeks gestation of pregnancy: Secondary | ICD-10-CM | POA: Insufficient documentation

## 2024-06-20 DIAGNOSIS — O479 False labor, unspecified: Secondary | ICD-10-CM | POA: Diagnosis not present

## 2024-06-20 DIAGNOSIS — Z9289 Personal history of other medical treatment: Secondary | ICD-10-CM | POA: Diagnosis not present

## 2024-06-20 LAB — CBC
HCT: 33 % — ABNORMAL LOW (ref 36.0–46.0)
Hemoglobin: 11.9 g/dL — ABNORMAL LOW (ref 12.0–15.0)
MCH: 28.5 pg (ref 26.0–34.0)
MCHC: 36.1 g/dL — ABNORMAL HIGH (ref 30.0–36.0)
MCV: 78.9 fL — ABNORMAL LOW (ref 80.0–100.0)
Platelets: 425 K/uL — ABNORMAL HIGH (ref 150–400)
RBC: 4.18 MIL/uL (ref 3.87–5.11)
RDW: 13.4 % (ref 11.5–15.5)
WBC: 8.4 K/uL (ref 4.0–10.5)
nRBC: 0 % (ref 0.0–0.2)

## 2024-06-20 LAB — URINALYSIS, ROUTINE W REFLEX MICROSCOPIC
Bilirubin Urine: NEGATIVE
Glucose, UA: NEGATIVE mg/dL
Hgb urine dipstick: NEGATIVE
Ketones, ur: NEGATIVE mg/dL
Leukocytes,Ua: NEGATIVE
Nitrite: NEGATIVE
Protein, ur: NEGATIVE mg/dL
Specific Gravity, Urine: 1.011 (ref 1.005–1.030)
pH: 7 (ref 5.0–8.0)

## 2024-06-20 LAB — WET PREP, GENITAL
Clue Cells Wet Prep HPF POC: NONE SEEN
Sperm: NONE SEEN
Trich, Wet Prep: NONE SEEN
WBC, Wet Prep HPF POC: >=10 — AB
Yeast Wet Prep HPF POC: NONE SEEN

## 2024-06-20 MED ORDER — LACTATED RINGERS IV BOLUS
1000.0000 mL | Freq: Once | INTRAVENOUS | Status: AC
  Administered 2024-06-20: 1000 mL via INTRAVENOUS

## 2024-06-20 NOTE — Discharge Instructions (Signed)
 Fetal testing is reassuring.  Follow-up with your [facility] provider as scheduled

## 2024-06-20 NOTE — MAU Provider Note (Signed)
 Chief Complaint:  Cyst (Peritoneal cyst ) Mucus plug  HPI    Tami Hall is a 19 y.o. G1P0 at [redacted]w[redacted]d who presents to maternity admissions reporting that she has had off-and-on abdominal cramping and increased vaginal discharge.  Patient also feels that there is something leaking and that she has an area near her anus that feels cystic, patient states quotation I feel like you need to poop but I do not need to poop no complaints of leaking fluid, vaginal bleeding, reports fetal movements.  Pain score 0 out of 10.   Pregnancy Course: [facility]  Past Medical History:  Diagnosis Date   Eczema    OB History  Gravida Para Term Preterm AB Living  1       SAB IAB Ectopic Multiple Live Births          # Outcome Date GA Lbr Len/2nd Weight Sex Type Anes PTL Lv  1 Current            Past Surgical History:  Procedure Laterality Date   MOUTH SURGERY     dental   Family History  Problem Relation Age of Onset   Healthy Mother    Healthy Father    Social History[1] Allergies[2] No medications prior to admission.    I have reviewed patient's Past Medical Hx, Surgical Hx, Family Hx, Social Hx, medications and allergies.   ROS  Pertinent items noted in HPI and remainder of comprehensive ROS otherwise negative.   PHYSICAL EXAM  Patient Vitals for the past 24 hrs:  BP Pulse Resp SpO2  06/20/24 2055 -- -- -- 100 %  06/20/24 2051 (!) 102/53 67 16 --    Constitutional: Well-developed, well-nourished female in no acute distress.  Cardiovascular: normal rate & rhythm, warm and well-perfused Respiratory: normal effort, no problems with respiration noted GI: Abd soft, non-tender, gravid MS: Extremities nontender, no edema, normal ROM Neurologic: Alert and oriented x 4.  Pelvic:   Dilation: Closed Effacement (%): 50 Cervical Position: Middle Station: -2 Presentation: Vertex Exam by:: Sanseverino, RN  Fetal Tracing: REACTIVE Baseline: 130 Variability: Moderate  Accelerations:   Prolonged accelerations Decelerations: No decelerations Toco: UI    Fetal tracing was reviewed with OB attending Dr. Abigail due to difficulty with reading NST due to prolonged fetal movements.  Patient was sent for a biophysical profile which was 8 out of 8 and per OB ultrasound technologist fetus was moving the entire time and sucking thumb during ultrasound imaging.  Overall reassuring fetal status.  Will plan for discharge home   Labs: Results for orders placed or performed during the hospital encounter of 06/20/24 (from the past 24 hours)  CBC     Status: Abnormal   Collection Time: 06/20/24  9:20 PM  Result Value Ref Range   WBC 8.4 4.0 - 10.5 K/uL   RBC 4.18 3.87 - 5.11 MIL/uL   Hemoglobin 11.9 (L) 12.0 - 15.0 g/dL   HCT 66.9 (L) 63.9 - 53.9 %   MCV 78.9 (L) 80.0 - 100.0 fL   MCH 28.5 26.0 - 34.0 pg   MCHC 36.1 (H) 30.0 - 36.0 g/dL   RDW 86.5 88.4 - 84.4 %   Platelets 425 (H) 150 - 400 K/uL   nRBC 0.0 0.0 - 0.2 %  Wet prep, genital     Status: Abnormal   Collection Time: 06/20/24 10:45 PM  Result Value Ref Range   Yeast Wet Prep HPF POC NONE SEEN NONE SEEN   Trich, Wet Prep  NONE SEEN NONE SEEN   Clue Cells Wet Prep HPF POC NONE SEEN NONE SEEN   WBC, Wet Prep HPF POC >=10 (A) <10   Sperm NONE SEEN   Urinalysis, Routine w reflex microscopic -Urine, Random     Status: Abnormal   Collection Time: 06/20/24 10:45 PM  Result Value Ref Range   Color, Urine YELLOW YELLOW   APPearance HAZY (A) CLEAR   Specific Gravity, Urine 1.011 1.005 - 1.030   pH 7.0 5.0 - 8.0   Glucose, UA NEGATIVE NEGATIVE mg/dL   Hgb urine dipstick NEGATIVE NEGATIVE   Bilirubin Urine NEGATIVE NEGATIVE   Ketones, ur NEGATIVE NEGATIVE mg/dL   Protein, ur NEGATIVE NEGATIVE mg/dL   Nitrite NEGATIVE NEGATIVE   Leukocytes,Ua NEGATIVE NEGATIVE    Imaging:  No results found.  MDM & MAU COURSE  MDM:  HIGH  BPP 10/10  MAU Course: Orders Placed This Encounter  Procedures   Wet prep, genital   US  MFM  FETAL BPP WO NON STRESS   Urinalysis, Routine w reflex microscopic -Urine, Random   CBC   Discharge patient Discharge disposition: 01-Home or Self Care; Discharge patient date: 06/20/2024   Meds ordered this encounter  Medications   lactated ringers  bolus 1,000 mL    ASSESSMENT   1. Peritoneal cyst   2. [redacted] weeks gestation of pregnancy   3. History of fetal biophysical profile with non-stress test   4. NST (non-stress test) reactive on fetal surveillance   5. False labor     PLAN  Discharge home in stable condition with return precautions.   Labor precautions reviewed with patient in detail  Follow-up with primary OB as scheduled  See AVS for full description of information given to the patient including both verbal and written. Patient verbalized understanding and agrees with the plan as described above.     Follow-up Information     Ob/[facility], [facility] Follow up.   Specialty: Obstetrics and Gynecology Why: If symptoms worsen or fail to resolve, As scheduled for ongoing prenatal care Contact information: [address] [place] [address] [phone]                 Allergies as of 06/21/2024   No Known Allergies      Medication List     TAKE these medications    hydrocortisone  2.5 % ointment Apply topically 2 (two) times daily. Apply to vaginal area for itching for 3 days only.   loratadine  5 MG/5ML syrup Commonly known as: Claritin  Take 5 mLs (5 mg total) by mouth daily.   ondansetron  4 MG disintegrating tablet Commonly known as: ZOFRAN -ODT Take 1 tablet (4 mg total) by mouth every 8 (eight) hours as needed for nausea or vomiting.   prenatal vitamin w/FE, FA 27-1 MG Tabs tablet Take 1 tablet by mouth daily at 12 noon.   triamcinolone  0.025 % ointment Commonly known as: KENALOG  Apply topically 2 (two) times daily. Apply bid x 7 days        Olam Dalton, MSN, [facility], [facility] [employer]          [1]  Social History Tobacco Use   Smoking status: Never   Smokeless tobacco: Never   Tobacco comments:    Stopped vaping end of May  Vaping Use   Vaping status: Former  Substance Use Topics   Alcohol use: Not Currently   Drug use: Not Currently    Frequency: 7.0 times per  week    Types: Marijuana    Comment: last 5/31  [2] No Known Allergies

## 2024-06-20 NOTE — MAU Note (Signed)
 Tami Hall is a 19 y.o. at [redacted]w[redacted]d here in MAU reporting: on and off lower abd cramping and increased vaginal d/c. Pt stated it feel likes I need to poop, like there is something there, but I do not have to poop. No c/o of LOF or VB, reports +FM.   LMP:  Onset of complaint: This evening  Pain score: 0/10  FHT: pt by-passed triage and placed straight in MAU exam room   Lab orders placed from triage:

## 2024-06-23 LAB — GC/CHLAMYDIA PROBE AMP (~~LOC~~) NOT AT ARMC
Chlamydia: NEGATIVE
Comment: NEGATIVE
Comment: NORMAL
Neisseria Gonorrhea: NEGATIVE

## 2024-06-28 ENCOUNTER — Encounter (HOSPITAL_COMMUNITY): Payer: Self-pay | Admitting: Obstetrics and Gynecology

## 2024-06-28 ENCOUNTER — Inpatient Hospital Stay (HOSPITAL_COMMUNITY)
Admission: AD | Admit: 2024-06-28 | Discharge: 2024-06-28 | Disposition: A | Discharge status: Home / Self Care | Attending: Obstetrics and Gynecology | Admitting: Obstetrics and Gynecology

## 2024-06-28 DIAGNOSIS — O26893 Other specified pregnancy related conditions, third trimester: Secondary | ICD-10-CM

## 2024-06-28 DIAGNOSIS — Z3A38 38 weeks gestation of pregnancy: Secondary | ICD-10-CM | POA: Diagnosis not present

## 2024-06-28 DIAGNOSIS — N898 Other specified noninflammatory disorders of vagina: Secondary | ICD-10-CM

## 2024-06-28 DIAGNOSIS — F17290 Nicotine dependence, other tobacco product, uncomplicated: Secondary | ICD-10-CM | POA: Diagnosis not present

## 2024-06-28 DIAGNOSIS — Z3689 Encounter for other specified antenatal screening: Secondary | ICD-10-CM

## 2024-06-28 DIAGNOSIS — Z3493 Encounter for supervision of normal pregnancy, unspecified, third trimester: Secondary | ICD-10-CM

## 2024-06-28 DIAGNOSIS — O99333 Smoking (tobacco) complicating pregnancy, third trimester: Secondary | ICD-10-CM | POA: Diagnosis not present

## 2024-06-28 LAB — POCT FERN TEST: POCT Fern Test: NEGATIVE

## 2024-06-28 NOTE — MAU Provider Note (Cosign Needed)
 Chief Complaint:  Rupture of Membranes and Contractions   HPI   None     Tami Hall is a 19 y.o. G1P0 at [redacted]w[redacted]d who presents to maternity admissions reporting possible ROM. She reports she was running around 1430 and had a small gush of fluid at the time. She had a little bid of fluid leaking afterwards, but not enough to put on a pad. She is still not wearing a pad. Denies vaginal bleeding, contractions. Endorses good fetal movement.   Additional history obtained from mother  Pregnancy Course: Receives care at [facility]. Prenatal records reviewed.   Past Medical History:  Diagnosis Date   Eczema    OB History  Gravida Para Term Preterm AB Living  1       SAB IAB Ectopic Multiple Live Births          # Outcome Date GA Lbr Len/2nd Weight Sex Type Anes PTL Lv  1 Current            Past Surgical History:  Procedure Laterality Date   MOUTH SURGERY     dental   Family History  Problem Relation Age of Onset   Healthy Mother    Healthy Father    Social History[1] Allergies[2] Medications Prior to Admission  Medication Sig Dispense Refill Last Dose/Taking   prenatal vitamin w/FE, FA (PRENATAL 1 + 1) 27-1 MG TABS tablet Take 1 tablet by mouth daily at 12 noon. 30 tablet 0 06/27/2024   hydrocortisone  2.5 % ointment Apply topically 2 (two) times daily. Apply to vaginal area for itching for 3 days only. 20 g 0    loratadine  (CLARITIN ) 5 MG/5ML syrup Take 5 mLs (5 mg total) by mouth daily. 236 mL 2    ondansetron  (ZOFRAN -ODT) 4 MG disintegrating tablet Take 1 tablet (4 mg total) by mouth every 8 (eight) hours as needed for nausea or vomiting. 15 tablet 0    triamcinolone  (KENALOG ) 0.025 % ointment Apply topically 2 (two) times daily. Apply bid x 7 days 30 g 0     I have reviewed patient's Past Medical Hx, Surgical Hx, Family Hx, Social Hx, medications and allergies.   ROS  Pertinent items noted in HPI and remainder of comprehensive ROS otherwise negative.    PHYSICAL EXAM  Patient Vitals for the past 24 hrs:  BP Temp Temp src Pulse Resp SpO2 Height Weight  06/28/24 1954 122/73 98.3 F (36.8 C) Oral 77 17 100 % 5' 2 (1.575 m) 74.2 kg    Constitutional: Well-developed, well-nourished female in no acute distress.  Cardiovascular: normal rate, warm and well-perfused Respiratory: normal effort, no problems with respiration noted GI: Abd soft, non-tender, non-distended MSK: Extremities nontender, no edema, normal ROM Skin: warm and dry. Acyanotic, no jaundice or pallor. Neurologic: Alert and oriented x 4. No abnormal coordination. Psychiatric: Normal mood. Speech not slurred, not rapid/pressured. Patient is cooperative. Pelvic exam: VULVA: normal appearing vulva with no masses, tenderness or lesions, VAGINA: normal appearing vagina with normal color and discharge, no lesions, no pooling, CERVIX: normal appearing cervix without discharge or lesions, nulliparous os visually closed, exam chaperoned by Tami Loges RN.      Fetal Tracing: Baseline FHR: 130 per minute Fetal heart variability: moderate Fetal Heart Rate accelerations: yes Fetal Heart Rate decelerations: none Fetal Non-stress Test: Category I (reactive) Toco: uterine irritability   Labs: Results for orders placed or performed during the hospital encounter of 06/28/24 (from the past 24 hours)  POCT fern test  Status: Normal   Collection Time: 06/28/24  8:17 PM  Result Value Ref Range   POCT Fern Test Negative = intact amniotic membranes     Imaging:  No results found.  MDM & MAU COURSE  MDM: Moderate  MAU Course: -Vital signs within normal limits. -Fern negative and no pooling on exam, ruled out ROM. -NST reactive. -Reassured patient that she did the right thing coming to MAU to be evaluated and to return for any concern for possible ROM, vaginal bleeding, or contractions.  Orders Placed This Encounter  Procedures   Contraction - monitoring   External fetal  heart monitoring   Vaginal exam   POCT fern test   Discharge patient   No orders of the defined types were placed in this encounter.   ASSESSMENT   1. Vaginal discharge   2. Intact amniotic membranes during pregnancy in third trimester   3. NST (non-stress test) reactive   4. [redacted] weeks gestation of pregnancy     PLAN  Discharge home in stable condition with labor precautions.  Follow up with [facility] as scheduled for prenatal care.     Allergies as of 06/28/2024   No Known Allergies      Medication List     TAKE these medications    hydrocortisone  2.5 % ointment Apply topically 2 (two) times daily. Apply to vaginal area for itching for 3 days only.   loratadine  5 MG/5ML syrup Commonly known as: Claritin  Take 5 mLs (5 mg total) by mouth daily.   ondansetron  4 MG disintegrating tablet Commonly known as: ZOFRAN -ODT Take 1 tablet (4 mg total) by mouth every 8 (eight) hours as needed for nausea or vomiting.   prenatal vitamin w/FE, FA 27-1 MG Tabs tablet Take 1 tablet by mouth daily at 12 noon.   triamcinolone  0.025 % ointment Commonly known as: KENALOG  Apply topically 2 (two) times daily. Apply bid x 7 days        Tami DELENA Sear, PA     [1]  Social History Tobacco Use   Smoking status: Never   Smokeless tobacco: Never   Tobacco comments:    Stopped vaping end of May  Vaping Use   Vaping status: Former  Substance Use Topics   Alcohol use: Not Currently   Drug use: Not Currently    Frequency: 7.0 times per week    Types: Marijuana    Comment: last 5/31  [2] No Known Allergies

## 2024-06-28 NOTE — MAU Note (Signed)
..  Tami Hall is a 19 y.o. at [redacted]w[redacted]d here in MAU reporting: a gush of fluid around 2:30pm when she was running. Reports she had some leaking afterwards. Is not wearing a pad now. Denies contractions or vaginal bleeding. Feels a lot of pelvic pressure.  +FM  Pain score: 0/10 Vitals:   06/28/24 1954  BP: 122/73  Pulse: 77  Resp: 17  Temp: 98.3 F (36.8 C)  SpO2: 100%     FHT:135 Lab orders placed from triage:  MAU labor eval

## 2024-06-28 NOTE — Discharge Instructions (Signed)
 Reasons to return to MAU at [facility]: You begin to have strong, frequent contractions 5 minutes apart or less, each last 1 minute, these have been going on for 1-2 hours, and you cannot walk or talk during them. Your water breaks.  Sometimes it is a big gush of fluid. However, many times it may it may be much more subtle. You should go to the hospital if you have a constant leakage of fluid from your vagina, enough to soak a pad when you are walking around.  You have vaginal bleeding.  It is normal to have a small amount of spotting if your cervix was checked. If you have bleeding requiring the use of a pad, go to the hospital. You don't feel your baby moving like normal.  If you think that you baby's movement is decreased, eat a snack and rest on your left side in a quiet room for one hour. If you have not felt the baby move more than 6 times in an hour GO TO THE HOSPITAL.

## 2024-07-03 ENCOUNTER — Inpatient Hospital Stay (HOSPITAL_COMMUNITY)
Admission: AD | Admit: 2024-07-03 | Discharge: 2024-07-03 | Disposition: A | Discharge status: Home / Self Care | Payer: Self-pay | Attending: Obstetrics and Gynecology | Admitting: Obstetrics and Gynecology

## 2024-07-03 DIAGNOSIS — O479 False labor, unspecified: Secondary | ICD-10-CM

## 2024-07-03 DIAGNOSIS — Z3A38 38 weeks gestation of pregnancy: Secondary | ICD-10-CM

## 2024-07-03 DIAGNOSIS — Z3689 Encounter for other specified antenatal screening: Secondary | ICD-10-CM

## 2024-07-03 NOTE — MAU Provider Note (Signed)
 Patient was assessed for active labor and managed by nursing staff during this encounter. I have reviewed the chart and agree with the documentation and plan. I have also reviewed the NST for appropriate reactivity.  Fetal Tracing: Baseline: 135 Variability: moderate Accelerations: yes  Decelerations: none Toco: uterine contractions every 4 minutes Dilation: Fingertip Effacement (%): 50 Cervical Position: Middle Station: -2 Presentation: Vertex Exam by:: K.Wilson,RN  Patient stable for discharge home with labor precautions.  Joesph Sear, PA-C  [facility] for [employer], [facility]

## 2024-07-03 NOTE — MAU Note (Signed)
 MAU Labor Evaluation RN Medical Screening Exam: RN Assessment:  [name] is a 19 y.o. at [redacted]w[redacted]d here in MAU for evaluation of labor. See RN triage note.   Pain Score: 6  Pain Location: Abdomen  Cervical exam:  Dilation: Fingertip Effacement (%): 50 Cervical Position: Middle Station: -2 Presentation: Vertex Exam by:: Tami Manso,RN  Fetal Monitoring: Baseline Rate (A): 135 bpm Variability: Moderate Accelerations: 15 x 15 Decelerations: None Contraction Frequency (min): 4-5  Vitals:   07/03/24 1519  BP: 126/77  Pulse: 76  Resp: 18  Temp: 98.5 F (36.9 C)      Medical Decision Making & Provider Communication:  This RN has communicated with:    Communicated with MAU provider  M. Edstrom,PA SBAR report of labor evaluation. Also reviewed contraction pattern and that non-stress test is reactive.  Contraction Frequency (min): 4-5 has been documented with no cervical change since last check 5 days ago not indicating active labor.  Patient denies any other complaints.  Based on this report provider has given order for discharge. A discharge order and diagnosis entered by a provider. Labor discharge precautions reviewed with patient and all questions answered. Patient verbalized understanding.   Plan of Care: Discharge home with strict labor precautions

## 2024-07-03 NOTE — MAU Note (Signed)
 Tami Hall is a 19 y.o. at [redacted]w[redacted]d here in MAU reporting: ctx sine 1:30 pm. About 3-5 min aprt. Denies any vag bleeding or leaking at this time. Good fetal movement reported.    LMP:  Onset of complaint: 1:30pm Pain score: 6 Vitals:   07/03/24 1519  BP: 126/77  Pulse: 76  Resp: 18  Temp: 98.5 F (36.9 C)     FHT: 145  Lab orders placed from triage: labor eval

## 2024-07-05 ENCOUNTER — Inpatient Hospital Stay (HOSPITAL_COMMUNITY)

## 2024-07-05 ENCOUNTER — Inpatient Hospital Stay (HOSPITAL_COMMUNITY)
Admission: AD | Admit: 2024-07-05 | Discharge: 2024-07-09 | Disposition: A | Payer: Self-pay | Source: Home / Self Care | Attending: Obstetrics and Gynecology | Admitting: Obstetrics and Gynecology

## 2024-07-05 ENCOUNTER — Encounter (HOSPITAL_COMMUNITY): Payer: Self-pay | Admitting: Obstetrics & Gynecology

## 2024-07-05 ENCOUNTER — Other Ambulatory Visit: Payer: Self-pay

## 2024-07-05 DIAGNOSIS — O9A213 Injury, poisoning and certain other consequences of external causes complicating pregnancy, third trimester: Secondary | ICD-10-CM | POA: Diagnosis not present

## 2024-07-05 DIAGNOSIS — Z3A39 39 weeks gestation of pregnancy: Secondary | ICD-10-CM

## 2024-07-05 DIAGNOSIS — O4103X0 Oligohydramnios, third trimester, not applicable or unspecified: Principal | ICD-10-CM

## 2024-07-05 DIAGNOSIS — W19XXXA Unspecified fall, initial encounter: Secondary | ICD-10-CM

## 2024-07-05 DIAGNOSIS — R030 Elevated blood-pressure reading, without diagnosis of hypertension: Secondary | ICD-10-CM | POA: Diagnosis not present

## 2024-07-05 DIAGNOSIS — O4100X0 Oligohydramnios, unspecified trimester, not applicable or unspecified: Secondary | ICD-10-CM | POA: Diagnosis present

## 2024-07-05 LAB — CBC
HCT: 33.6 % — ABNORMAL LOW (ref 36.0–46.0)
Hemoglobin: 12.4 g/dL (ref 12.0–15.0)
MCH: 28.8 pg (ref 26.0–34.0)
MCHC: 36.9 g/dL — ABNORMAL HIGH (ref 30.0–36.0)
MCV: 78.1 fL — ABNORMAL LOW (ref 80.0–100.0)
Platelets: 451 10*3/uL — ABNORMAL HIGH (ref 150–400)
RBC: 4.3 MIL/uL (ref 3.87–5.11)
RDW: 13.7 % (ref 11.5–15.5)
WBC: 10 10*3/uL (ref 4.0–10.5)
nRBC: 0 % (ref 0.0–0.2)

## 2024-07-05 LAB — TYPE AND SCREEN
ABO/RH(D): A POS
Antibody Screen: NEGATIVE

## 2024-07-05 MED ORDER — OXYCODONE-ACETAMINOPHEN 5-325 MG PO TABS: 1.0000 | ORAL_TABLET | ORAL | Status: DC | PRN

## 2024-07-05 MED ORDER — LIDOCAINE HCL (PF) 1 % IJ SOLN: 30.0000 mL | INTRAMUSCULAR | Status: DC | PRN

## 2024-07-05 MED ORDER — FENTANYL CITRATE (PF) 100 MCG/2ML IJ SOLN
50.0000 ug | INTRAMUSCULAR | Status: DC | PRN
  Administered 2024-07-06: 100 ug via INTRAVENOUS
  Filled 2024-07-05: qty 2

## 2024-07-05 MED ORDER — SOD CITRATE-CITRIC ACID 500-334 MG/5ML PO SOLN: 30.0000 mL | ORAL | Status: DC | PRN

## 2024-07-05 MED ORDER — OXYTOCIN BOLUS FROM INFUSION
333.0000 mL | Freq: Once | INTRAVENOUS | Status: AC
  Administered 2024-07-07: 333 mL via INTRAVENOUS

## 2024-07-05 MED ORDER — LACTATED RINGERS IV SOLN: 500.0000 mL | INTRAVENOUS | Status: AC | PRN

## 2024-07-05 MED ORDER — ACETAMINOPHEN 325 MG PO TABS: 650.0000 mg | ORAL_TABLET | ORAL | Status: DC | PRN

## 2024-07-05 MED ORDER — ONDANSETRON HCL 4 MG/2ML IJ SOLN
4.0000 mg | Freq: Four times a day (QID) | INTRAMUSCULAR | Status: DC | PRN
  Administered 2024-07-06: 4 mg via INTRAVENOUS
  Filled 2024-07-05: qty 2

## 2024-07-05 MED ORDER — LACTATED RINGERS IV SOLN: INTRAVENOUS | Status: AC

## 2024-07-05 MED ORDER — OXYTOCIN-SODIUM CHLORIDE 30-0.9 UT/500ML-% IV SOLN
2.5000 [IU]/h | INTRAVENOUS | Status: DC
  Filled 2024-07-05 (×2): qty 500

## 2024-07-05 MED ORDER — OXYCODONE-ACETAMINOPHEN 5-325 MG PO TABS: 2.0000 | ORAL_TABLET | ORAL | Status: DC | PRN

## 2024-07-05 NOTE — MAU Note (Signed)
.  Tami Hall is a 18 y.o. at [redacted]w[redacted]d here in MAU reporting: 20 minutes ago patient fell in the snow hit belly, but braced fall on all four's initially but baby is moving normal now  Denies vaginal bleeding or LOF   Onset of complaint: TODAY Pain score: 4/10 abdominal cramping  Vitals:   07/05/24 1135  BP: 124/76  Pulse: 92  Resp: 20  Temp: 98.9 F (37.2 C)  SpO2: 99%     FHT:146 Lab orders placed from triage:   none

## 2024-07-05 NOTE — MAU Provider Note (Signed)
 " History     CSN: [id number]  Arrival date and time: 07/05/24 1125   Event Date/Time   First Provider Initiated Contact with Patient 07/05/24 1219      Chief Complaint  Patient presents with   Fall   HPI [name] is a 19 y.o. year old G1P0 female at [redacted]w[redacted]d weeks gestation who presents to MAU via EMS reporting she was recording a TikTok with her cousin at 10:30 AM. She reports that after they finished recording, she fell on the way back in the house. When she fell, she hit her belly, but was able to brace herself on her hands and knees. She reports decreased/no fetal movement after the fall, but she now feels good vigorous fetal movement since arriving to MAU. She denies vaginal bleeding or leaking of fluid. She endorses some pelvic pain that she is not sure if it is contractions pain or not. She receives [facility] with [facility]. Her partner is present and contributing to the history taking.   OB History     Gravida  1   Para      Term      Preterm      AB      Living         SAB      IAB      Ectopic      Multiple      Live Births              Past Medical History:  Diagnosis Date   Eczema     Past Surgical History:  Procedure Laterality Date   MOUTH SURGERY     dental    Family History  Problem Relation Age of Onset   Healthy Mother    Healthy Father     Social History[1]  Allergies: Allergies[2]  Medications Prior to Admission  Medication Sig Dispense Refill Last Dose/Taking   hydrocortisone  2.5 % ointment Apply topically 2 (two) times daily. Apply to vaginal area for itching for 3 days only. 20 g 0    loratadine  (CLARITIN ) 5 MG/5ML syrup Take 5 mLs (5 mg total) by mouth daily. 236 mL 2    ondansetron  (ZOFRAN -ODT) 4 MG disintegrating tablet Take 1 tablet (4 mg total) by mouth every 8 (eight) hours as needed for nausea or vomiting. 15 tablet 0    prenatal vitamin w/FE, FA (PRENATAL 1 + 1) 27-1 MG TABS tablet Take 1 tablet  by mouth daily at 12 noon. 30 tablet 0    triamcinolone  (KENALOG ) 0.025 % ointment Apply topically 2 (two) times daily. Apply bid x 7 days 30 g 0     Review of Systems  Constitutional: Negative.   HENT: Negative.    Eyes: Negative.   Respiratory: Negative.    Cardiovascular: Negative.   Gastrointestinal: Negative.   Endocrine: Negative.   Genitourinary:  Positive for pelvic pain (cramping).  Musculoskeletal: Negative.   Skin: Negative.   Allergic/Immunologic: Negative.   Neurological: Negative.   Hematological: Negative.   Psychiatric/Behavioral: Negative.     Physical Exam   Blood pressure 124/76, pulse 92, temperature 98.9 F (37.2 C), resp. rate 20, last menstrual period 10/06/2023, SpO2 99%.  Physical Exam Vitals and nursing note reviewed. Exam conducted with a chaperone present Ivy Budd, CHAR[employer]DR[employer]).  Constitutional:      Appearance: Normal appearance. She is normal weight.  Cardiovascular:     Rate and Rhythm: Normal rate.  Pulmonary:     Effort:  Pulmonary effort is normal.  Abdominal:     Palpations: Abdomen is soft.  Genitourinary:    General: Normal vulva.     Comments: Dilation: 2.5 Effacement (%): 70 Station: -2 Presentation: Vertex Exam by: Ala Cart, CNM  Musculoskeletal:        General: Normal range of motion.  Skin:    General: Skin is warm and dry.  Neurological:     Mental Status: She is alert and oriented to person, place, and time.  Psychiatric:        Mood and Affect: Mood normal.        Behavior: Behavior normal.        Thought Content: Thought content normal.        Judgment: Judgment normal.    REACTIVE NST - FHR: 135 bpm / moderate variability / accels present / decels absent / TOCO: regular every 5 mins  MAU Course  Procedures  MDM OB MFM Limited U/S *Consult with Dr. Ozan @ 1421 - notified of patient's complaints, assessments, lab & U/S results, recommended tx plan admit for IOL d/t oligohydramnios - ok to d/c home, agrees with  plan    Assessment and Plan  1. Oligohydramnios antepartum, third trimester, not applicable or unspecified fetus (Primary) 2. Traumatic injury during pregnancy in third trimester 3. [redacted] weeks gestation of pregnancy  - Admit to L&D - Routine admission orders - Dr. Armond will enter orders and assumes care of patient upon admission  Ala Cart, CNM 07/05/2024, 12:19 PM     [1]  Social History Tobacco Use   Smoking status: Never   Smokeless tobacco: Never   Tobacco comments:    Stopped vaping end of May  Vaping Use   Vaping status: Former  Substance Use Topics   Alcohol use: Not Currently   Drug use: Not Currently    Frequency: 7.0 times per week    Types: Marijuana    Comment: last 5/31  [2] No Known Allergies  "

## 2024-07-05 NOTE — Progress Notes (Signed)
" ° °  Labor Progress Note  Tami Hall, 19 y.o., G1P0, with an IUP @ [redacted]w[redacted]d, presented for early labor. Pt was originally admitted for a fall and noted to be oligo. GBS-.  Subjective: Pt on birthing ball, feeling tired, mild irregular cramps but tolerates very well, discussed plan of care, pt strongly desires a shower and dinner break, plan for over night is expectant management, pt in early labor, if no cervical change over night will augment.  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 No intake/output data recorded. NST: FHR baseline 130 bpm, Variability: moderate, Accelerations:present, Decelerations:  Absent= Cat 1/Reactive CTX:  irregular, every 2-5 minutes Uterus gravid, soft non tender, mild to palpate with contractions.  SVE:  Dilation: 3 Effacement (%): 90 Station: -2 Exam by:: Koua Deeg , CNM Pitocin  at 55mUn/min  Assessment:  Tami Hall, 19 y.o., G1P0, with an IUP @ [redacted]w[redacted]d, presented for early labor. Pt was originally admitted for a fall and noted to be oligo. GBS-. 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 Will reassess with cervical exam at 6 hours or earlier if necessary Expectant management  Anticipate labor progression and vaginal delivery.    [name]  CNM, FNP-C, PMHNP-BC  [address]  [place], [place] [address]  Cell: [phone]  Office Phone: [phone] Fax: [phone] 07/05/2024  6:30 PM    "

## 2024-07-05 NOTE — Plan of Care (Signed)
  Problem: Education: Goal: Knowledge of Childbirth will improve Outcome: Progressing Goal: Ability to make informed decisions regarding treatment and plan of care will improve Outcome: Progressing Goal: Ability to state and carry out methods to decrease the pain will improve Outcome: Progressing Goal: Individualized Educational Video(s) Outcome: Progressing   Problem: Coping: Goal: Ability to verbalize concerns and feelings about labor and delivery will improve Outcome: Progressing   Problem: Life Cycle: Goal: Ability to make normal progression through stages of labor will improve Outcome: Progressing Goal: Ability to effectively push during vaginal delivery will improve Outcome: Progressing   Problem: Role Relationship: Goal: Will demonstrate positive interactions with the child Outcome: Progressing   Problem: Safety: Goal: Risk of complications during labor and delivery will decrease Outcome: Progressing   Problem: Pain Management: Goal: Relief or control of pain from uterine contractions will improve Outcome: Progressing   Problem: Education: Goal: Knowledge of General Education information will improve Description: Including pain rating scale, medication(s)/side effects and non-pharmacologic comfort measures Outcome: Progressing   Problem: Health Behavior/Discharge Planning: Goal: Ability to manage health-related needs will improve Outcome: Progressing   Problem: Clinical Measurements: Goal: Ability to maintain clinical measurements within normal limits will improve Outcome: Progressing Goal: Will remain free from infection Outcome: Progressing Goal: Diagnostic test results will improve Outcome: Progressing Goal: Respiratory complications will improve Outcome: Progressing Goal: Cardiovascular complication will be avoided Outcome: Progressing   Problem: Activity: Goal: Risk for activity intolerance will decrease Outcome: Progressing   Problem:  Nutrition: Goal: Adequate nutrition will be maintained Outcome: Progressing   Problem: Coping: Goal: Level of anxiety will decrease Outcome: Progressing   Problem: Elimination: Goal: Will not experience complications related to bowel motility Outcome: Progressing Goal: Will not experience complications related to urinary retention Outcome: Progressing   Problem: Pain Managment: Goal: General experience of comfort will improve and/or be controlled Outcome: Progressing   Problem: Safety: Goal: Ability to remain free from injury will improve Outcome: Progressing   Problem: Skin Integrity: Goal: Risk for impaired skin integrity will decrease Outcome: Progressing

## 2024-07-05 NOTE — H&P (Signed)
 Tami Hall is a 19 y.o. female presenting for slip and fall while doing a tic toc outside.  Now has good FM. OB History     Gravida  1   Para      Term      Preterm      AB      Living         SAB      IAB      Ectopic      Multiple      Live Births             Past Medical History:  Diagnosis Date   Eczema    Past Surgical History:  Procedure Laterality Date   MOUTH SURGERY     dental   Family History: family history includes Healthy in her father and mother. Social History:  reports that she has never smoked. She has never used smokeless tobacco. She reports that she does not currently use alcohol. She reports that she does not currently use drugs after having used the following drugs: Marijuana. Frequency: 7.00 times per week.     Maternal Diabetes: No Genetic Screening: Abnormal:  Results: Other: Maternal Ultrasounds/Referrals: Normal Fetal Ultrasounds or other Referrals:  None Maternal Substance Abuse:  No Significant Maternal Medications:  None Significant Maternal Lab Results:  Group B Strep negative Number of Prenatal Visits:greater than 3 verified prenatal visits Maternal Vaccinations:Covid Other Comments:  None  Review of Systems History Physical Examination: General appearance - alert, well appearing, and in no distress Chest - clear to auscultation, no wheezes, rales or rhonchi, symmetric air entry Heart - normal rate and regular rhythm Abdomen - soft, nontender, nondistended, no masses or organomegaly Gravid  Extremities - Homan's sign negative bilaterally  Dilation: 2.5 Effacement (%): 70 Station: -2 Exam by:: Ala Cart, CNM Blood pressure 124/76, pulse 92, temperature 98.9 F (37.2 C), resp. rate 20, last menstrual period 10/06/2023, SpO2 99%. Exam Physical Exam  Prenatal labs: ABO, Rh: --/--/A POS, A POS (10/16 2042) Antibody:   Rubella:   RPR: NON REACTIVE (11/10 1706)  HBsAg: NON REACTIVE (11/10 1706)  HIV: NON  REACTIVE (11/10 1706)  GBS:   neg  Assessment/Plan: IUP at 39 weeks s/p abdominal trauma and oligohydramnios Will augment labor.  Will decid eafter IV started.  She is contracting on her on Occ variable with cat 1 Toco q 1-5 min GBS neg Pain management per pt request    Tami Hall 07/05/2024, 2:46 PM

## 2024-07-06 ENCOUNTER — Inpatient Hospital Stay (HOSPITAL_COMMUNITY): Admitting: Anesthesiology

## 2024-07-06 DIAGNOSIS — R030 Elevated blood-pressure reading, without diagnosis of hypertension: Secondary | ICD-10-CM | POA: Diagnosis not present

## 2024-07-06 LAB — CBC WITH DIFFERENTIAL/PLATELET
Abs Immature Granulocytes: 0.05 10*3/uL (ref 0.00–0.07)
Basophils Absolute: 0 10*3/uL (ref 0.0–0.1)
Basophils Relative: 0 %
Eosinophils Absolute: 0 10*3/uL (ref 0.0–0.5)
Eosinophils Relative: 0 %
HCT: 31.2 % — ABNORMAL LOW (ref 36.0–46.0)
Hemoglobin: 11 g/dL — ABNORMAL LOW (ref 12.0–15.0)
Immature Granulocytes: 0 %
Lymphocytes Relative: 15 %
Lymphs Abs: 1.8 10*3/uL (ref 0.7–4.0)
MCH: 28.2 pg (ref 26.0–34.0)
MCHC: 35.3 g/dL (ref 30.0–36.0)
MCV: 80 fL (ref 80.0–100.0)
Monocytes Absolute: 0.7 10*3/uL (ref 0.1–1.0)
Monocytes Relative: 6 %
Neutro Abs: 9.5 10*3/uL — ABNORMAL HIGH (ref 1.7–7.7)
Neutrophils Relative %: 79 %
Platelets: 375 10*3/uL (ref 150–400)
RBC: 3.9 MIL/uL (ref 3.87–5.11)
RDW: 13.6 % (ref 11.5–15.5)
WBC: 12.1 10*3/uL — ABNORMAL HIGH (ref 4.0–10.5)
nRBC: 0 % (ref 0.0–0.2)

## 2024-07-06 LAB — COMPREHENSIVE METABOLIC PANEL WITH GFR
ALT: 10 U/L (ref 0–44)
AST: 24 U/L (ref 15–41)
Albumin: 3.3 g/dL — ABNORMAL LOW (ref 3.5–5.0)
Alkaline Phosphatase: 198 U/L — ABNORMAL HIGH (ref 38–126)
Anion gap: 11 (ref 5–15)
BUN: <5 mg/dL — ABNORMAL LOW (ref 6–20)
CO2: 22 mmol/L (ref 22–32)
Calcium: 8.9 mg/dL (ref 8.9–10.3)
Chloride: 102 mmol/L (ref 98–111)
Creatinine, Ser: 0.75 mg/dL (ref 0.44–1.00)
GFR, Estimated: >60 mL/min
Glucose, Bld: 86 mg/dL (ref 70–99)
Potassium: 4.1 mmol/L (ref 3.5–5.1)
Sodium: 135 mmol/L (ref 135–145)
Total Bilirubin: 0.5 mg/dL (ref 0.0–1.2)
Total Protein: 6.2 g/dL — ABNORMAL LOW (ref 6.5–8.1)

## 2024-07-06 LAB — PROTEIN / CREATININE RATIO, URINE
Creatinine, Urine: 134 mg/dL
Protein Creatinine Ratio: 0.4 mg/mg — ABNORMAL HIGH
Total Protein, Urine: 50 mg/dL

## 2024-07-06 LAB — SYPHILIS: RPR W/REFLEX TO RPR TITER AND TREPONEMAL ANTIBODIES, TRADITIONAL SCREENING AND DIAGNOSIS ALGORITHM: RPR Ser Ql: NONREACTIVE

## 2024-07-06 MED ORDER — LACTATED RINGERS IV SOLN: INTRAVENOUS | Status: DC

## 2024-07-06 MED ORDER — PHENYLEPHRINE 80 MCG/ML (10ML) SYRINGE FOR IV PUSH (FOR BLOOD PRESSURE SUPPORT): 80.0000 ug | PREFILLED_SYRINGE | INTRAVENOUS | Status: DC | PRN

## 2024-07-06 MED ORDER — EPHEDRINE 5 MG/ML INJ: 10.0000 mg | INTRAVENOUS | Status: DC | PRN

## 2024-07-06 MED ORDER — FENTANYL-BUPIVACAINE-NACL 0.5-0.125-0.9 MG/250ML-% EP SOLN
12.0000 mL/h | EPIDURAL | Status: DC | PRN
  Administered 2024-07-06: 12 mL/h via EPIDURAL
  Filled 2024-07-06: qty 250

## 2024-07-06 MED ORDER — DIPHENHYDRAMINE HCL 50 MG/ML IJ SOLN
12.5000 mg | INTRAMUSCULAR | Status: DC | PRN
  Administered 2024-07-06 – 2024-07-07 (×2): 12.5 mg via INTRAVENOUS
  Filled 2024-07-06: qty 1

## 2024-07-06 MED ORDER — LACTATED RINGERS IV SOLN: 500.0000 mL | Freq: Once | INTRAVENOUS | Status: DC

## 2024-07-06 MED ORDER — LIDOCAINE HCL (PF) 1 % IJ SOLN
INTRAMUSCULAR | Status: DC | PRN
  Administered 2024-07-06 (×2): 4 mL via EPIDURAL

## 2024-07-06 MED ORDER — LACTATED RINGERS IV SOLN
500.0000 mL | INTRAVENOUS | Status: DC | PRN
  Administered 2024-07-06 – 2024-07-07 (×2): 500 mL via INTRAVENOUS

## 2024-07-06 MED ORDER — TERBUTALINE SULFATE 1 MG/ML IJ SOLN
0.2500 mg | Freq: Once | INTRAMUSCULAR | Status: AC | PRN
  Administered 2024-07-06: 0.25 mg via SUBCUTANEOUS
  Filled 2024-07-06: qty 1

## 2024-07-06 MED ORDER — OXYTOCIN-SODIUM CHLORIDE 30-0.9 UT/500ML-% IV SOLN
1.0000 m[IU]/min | INTRAVENOUS | Status: DC
  Administered 2024-07-06: 2 m[IU]/min via INTRAVENOUS

## 2024-07-06 NOTE — Hospital Course (Signed)
 Remote review of strip BP (!) 113/56   Pulse 72   Temp 97.6 F (36.4 C) (Oral)   Resp 16   Ht 5' 2 (1.575 m)   Wt 74.2 kg   LMP 10/06/2023   SpO2 100%   BMI 29.92 kg/m   Good variabilty Toco q2-5 min Once adequate will check for cervical change.   If decels reoccur will offer CS

## 2024-07-06 NOTE — Progress Notes (Signed)
" ° °  Labor Progress Note  Tami Hall, 19 y.o., G1P0, with an IUP @ [redacted]w[redacted]d, presented for early labor. Pt was originally admitted for a fall and noted to be oligo. GBS-.  Subjective: Pt  stable, still feeling cxt pain but dissipating, with epidural, had to push the button 2 times, pt MVU have dropped to 150, iupc remains in place, continue to increase pitocin . Family still at bedside and supportive.  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 No intake/output data recorded. NST: FHR baseline 130 bpm, Variability: moderate, Accelerations:present, Decelerations:  Absent= Cat 1/Reactive CTX:  irregular, every 2-4 minutes Uterus gravid, soft non tender, mild to palpate with contractions.  SVE:  Dilation: 3 Effacement (%): 90 Station: -2 Exam by:: Sheena, RN Pitocin  at 59mUn/min IUPC remains in place   Assessment:  Tami Hall, 19 y.o., G1P0, with an IUP @ [redacted]w[redacted]d, presented for early labor. Pt was originally admitted for a fall and noted to be oligo. GBS-. In latent labor  Patient Active Problem List   Diagnosis Date Noted   Oligohydramnios 07/05/2024   Vaginal discharge 04/28/2020   Abnormal skin of vulva 04/28/2020   Yeast vaginitis 11/01/2018   Eczema 05/12/2011   Allergic rhinitis 08/18/2010   ASTEATOTIC ECZEMA 06/11/2009   Benign neoplasm of skin 05/20/2007   SHORT STATURE 04/12/2007   NICHD: Category 1  Membranes: AROM 2/1 @ 1055, clear, no s/s of infection  IUPC in Place: MVU 150  Induction:    Cytotec xN/A  Foley Bulb:  N/A  Pitocin  - 10  Pain management:               IV pain management: x PRN IV fentanyl   Nitrous: N/A             Epidural placement: Placed 2/1 @ 1244  GBS negative     Plan: Continue labor plan Continuous monitoring May come off the monitor now for a shower and dinner break Rest Ambulate Frequent position changes to facilitate fetal rotation and descent. Will reassess with cervical exam at 4 hours or earlier if necessary Continue pitocin  2x2 Anticipate labor progression and vaginal delivery.    Tami Hall  CNM, FNP-C, PMHNP-BC  [address]  [place], [place] [address]  Cell: [phone]  Office Phone: [phone] Fax: [phone] 07/06/2024  3:46 AM    "

## 2024-07-06 NOTE — Progress Notes (Signed)
" ° °  Labor Progress Note  Tami Hall, 19 y.o., G1P0, with an IUP @ [redacted]w[redacted]d, presented for early labor. Pt was originally admitted for a fall and noted to be oligo. GBS-.  Subjective: Pt sleeping, woke her upon entering, able to sleep through cxt and not complaining of pain, discussed R/B/A of pitocin  versus AROM, pt desires pitocin  at this time. Examination of her abdomen appears OP so does leopolds, suspect monitoring for bandhl's ring, r/t appears of double humps noted between upper and lower abdomen at this time, will monitor closely, no vaginal bleeding and pt denies pain at this time, plans for epidural.  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 No intake/output data recorded. NST: FHR baseline 130 bpm, Variability: moderate, Accelerations:present, Decelerations:  Absent= Cat 1/Reactive CTX:  irregular, every 3-7 minutes Uterus gravid, soft non tender, mild to palpate with contractions.  SVE:  Dilation: 3 Effacement (%): 90 Station: -2 Exam by:: [name]  Pitocin  at 10mUn/min  Assessment:  Tami Hall, 19 y.o., G1P0, with an IUP @ [redacted]w[redacted]d, presented for early labor. Pt was originally admitted for a fall and noted to be oligo. GBS-. In early labor  Patient Active Problem List   Diagnosis Date Noted   Oligohydramnios 07/05/2024   Vaginal discharge 04/28/2020   Abnormal skin of vulva 04/28/2020   Yeast vaginitis 11/01/2018   Eczema 05/12/2011   Allergic rhinitis 08/18/2010   ASTEATOTIC ECZEMA 06/11/2009   Benign neoplasm of skin 05/20/2007   SHORT  STATURE 04/12/2007   NICHD: Category 1  Membranes: Intact, no s/s of infection  Induction:    Cytotec xN/A  Foley Bulb: N/A  Pitocin  - 2  Pain management:               IV pain management: x PRN IV fentanyl   Nitrous: N/A             Epidural placement: desires once in pain or active labor  GBS negative     Plan: Continue labor plan Continuous monitoring May come off the monitor now for a shower and dinner break Rest Ambulate Frequent position changes to facilitate fetal rotation and descent. Will reassess with cervical exam at 6 hours or earlier if necessary Start pitocin  2x2 IN four hours will AROM and place IUPC after hands and knees performed.  Anticipate labor progression and vaginal delivery.    Jarold Macomber  CNM, FNP-C, PMHNP-BC  [address]  [place], [place] [address]  Cell: [phone]  Office Phone: [phone] Fax: [phone] 07/06/2024  3:46 AM    "

## 2024-07-06 NOTE — Anesthesia Procedure Notes (Signed)
 Epidural Patient location during procedure: OB Start time: 07/06/2024 12:52 PM End time: 07/06/2024 12:57 PM  Staffing Anesthesiologist: Peggye Delon Brunswick, MD Performed: anesthesiologist   Preanesthetic Checklist Completed: patient identified, IV checked, risks and benefits discussed, monitors and equipment checked, pre-op evaluation and timeout performed  Epidural Patient position: sitting Prep: DuraPrep and site prepped and draped Patient monitoring: continuous pulse ox and blood pressure Approach: midline Location: L3-L4 Injection technique: LOR saline  Needle:  Needle type: Tuohy  Needle gauge: 17 G Needle length: 9 cm and 9 Needle insertion depth: 5 cm Catheter type: closed end flexible Catheter size: 19 Gauge Catheter at skin depth: 9 cm Test dose: negative  Assessment Events: blood not aspirated, no cerebrospinal fluid, injection not painful, no injection resistance, no paresthesia and negative IV test  Additional Notes The patient has requested an epidural for labor pain management. Risks and benefits including, but not limited to, infection, bleeding, local anesthetic toxicity, headache, hypotension, back pain, block failure, etc. were discussed with the patient. The patient expressed understanding and consented to the procedure. I confirmed that the patient has no bleeding disorders and is not taking blood thinners. I confirmed the patient's last platelet count with the nurse. A time-out was performed immediately prior to the procedure. Please see nursing documentation for vital signs. Sterile technique was used throughout the whole procedure. Once LOR achieved, the epidural catheter threaded easily without resistance. Aspiration of the catheter was negative for blood and CSF. The epidural was dosed slowly and an infusion was started.  1 attempt(s)Reason for block:procedure for pain

## 2024-07-06 NOTE — Progress Notes (Signed)
" ° °  Labor Progress Note  Tami Hall, 19 y.o., G1P0, with an IUP @ [redacted]w[redacted]d, presented for early labor. Pt was originally admitted for a fall and noted to be oligo. GBS-.  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 No intake/output data recorded. NST: FHR baseline 130 bpm, Variability: moderate, Accelerations:present, Decelerations:  Absent= Cat 1/Reactive CTX:  irregular, every 2-4 minutes Uterus gravid, soft non tender, mild to palpate with contractions.  SVE:  Dilation: 6 Effacement (%): 90 Station: 0 Exam by:: Sheena, RN Pitocin  at 77mUn/min IUPC remains in place   Assessment:  Tami Hall, 19 y.o., G1P0, with an IUP @ [redacted]w[redacted]d, presented for early labor. Pt was originally admitted for a fall and noted to be oligo. GBS-. 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 Will reassess with cervical exam at 4 hours or earlier if necessary Continue pitocin  2x2 Elevated BP with no dx: monitor BP, cmc, cmp and urine pcr now.  Anticipate labor progression and vaginal delivery.   Dr Armond given report and assumed care of pt.   Terrika Zuver  CNM, FNP-C, PMHNP-BC  [address]  [place], [place] [address]  Cell: [phone]  Office Phone: [phone] Fax: [phone] 07/06/2024  3:46 AM    "

## 2024-07-06 NOTE — Progress Notes (Signed)
" ° °  Labor Progress Note  Tami Hall, 19 y.o., G1P0, with an IUP @ [redacted]w[redacted]d, presented for early labor. Pt was originally admitted for a fall and noted to be oligo. GBS-.  Subjective: Pt was able to rest, still feels slight cxt but tolerates well, not ready for pitocin  but plan for in the morning if no cervical change.  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 No intake/output data recorded. NST: FHR baseline 130 bpm, Variability: moderate, Accelerations:present, Decelerations:  Absent= Cat 1/Reactive CTX:  irregular, every 2-5 minutes Uterus gravid, soft non tender, mild to palpate with contractions.  SVE:  Dilation: 3 Effacement (%): 90 Station: -2 Exam by:: Joaquim Tolen  Pitocin  at 50mUn/min  Assessment:  Tami Hall, 19 y.o., G1P0, with an IUP @ [redacted]w[redacted]d, presented for early labor. Pt was originally admitted for a fall and noted to be oligo. GBS-. 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 Will reassess with cervical exam at 6 hours or earlier if necessary Plans to start pitocin   Anticipate labor progression and vaginal delivery.    Chetan Mehring  CNM, FNP-C, PMHNP-BC  [address]  [place], [place] [address]  Cell: [phone]  Office Phone: [phone] Fax: [phone] 07/06/2024  3:46 AM    "

## 2024-07-06 NOTE — Progress Notes (Signed)
" ° °  Labor Progress Note  Tami Hall, 19 y.o., G1P0, with an IUP @ [redacted]w[redacted]d, presented for early labor. Pt was originally admitted for a fall and noted to be oligo. GBS-.  Subjective: Pt  still not really feeling too much pain, discussed AROM and IUPC, cxt pattern hard to pick up on, discussed R/B/A and pt consented to  both, partner and mother at bedside and supportive. 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 No intake/output data recorded. NST: FHR baseline 130 bpm, Variability: moderate, Accelerations:present, Decelerations:  Absent= Cat 1/Reactive CTX:  irregular, every 2-4 minutes Uterus gravid, soft non tender, mild to palpate with contractions.  SVE:  Dilation: 3 Effacement (%): 90 Station: -2 Exam by:: Sheena, RN Pitocin  at 68mUn/min AROM, clear, IUPC placed, tolerated well.   Assessment:  Tami Hall, 19 y.o., G1P0, with an IUP @ [redacted]w[redacted]d, presented for early labor. Pt was originally admitted for a fall and noted to be oligo. GBS-. 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 Will reassess with cervical exam at 6 hours or earlier if necessary Continue pitocin  2x2 Anticipate labor progression and vaginal delivery.    Herminia Warren  CNM, FNP-C, PMHNP-BC  [address]  [place], [place] [address]  Cell: [phone]  Office Phone: [phone] Fax: [phone] 07/06/2024  3:46 AM    "

## 2024-07-06 NOTE — Anesthesia Preprocedure Evaluation (Signed)
 "                                  Anesthesia Evaluation  Patient identified by MRN, date of birth, ID band Patient awake    Reviewed: Allergy & Precautions, NPO status , Patient's Chart, lab work & pertinent test results  History of Anesthesia Complications Negative for: history of anesthetic complications  Airway Mallampati: III  TM Distance: >3 FB Neck ROM: Full    Dental   Pulmonary neg pulmonary ROS   Pulmonary exam normal breath sounds clear to auscultation       Cardiovascular negative cardio ROS  Rhythm:Regular Rate:Normal     Neuro/Psych negative neurological ROS     GI/Hepatic negative GI ROS, Neg liver ROS,,,  Endo/Other  negative endocrine ROS    Renal/GU negative Renal ROS     Musculoskeletal   Abdominal   Peds  Hematology negative hematology ROS (+) Lab Results      Component                Value               Date                      WBC                      10.0                07/05/2024                HGB                      12.4                07/05/2024                HCT                      33.6 (L)            07/05/2024                MCV                      78.1 (L)            07/05/2024                PLT                      451 (H)             07/05/2024              Anesthesia Other Findings   Reproductive/Obstetrics (+) Pregnancy                              Anesthesia Physical Anesthesia Plan  ASA: 2  Anesthesia Plan: Epidural   Post-op Pain Management:    Induction:   PONV Risk Score and Plan:   Airway Management Planned: Natural Airway  Additional Equipment:   Intra-op Plan:   Post-operative Plan:   Informed Consent: I have reviewed the patients History and Physical, chart, labs and discussed the procedure including the risks, benefits and alternatives for the proposed  anesthesia with the patient or authorized representative who has indicated his/her understanding and  acceptance.       Plan Discussed with: Anesthesiologist  Anesthesia Plan Comments: (I have discussed risks of neuraxial anesthesia including but not limited to infection, bleeding, nerve injury, back pain, headache, seizures, and failure of block. Patient denies bleeding disorders and is not currently anticoagulated. Labs have been reviewed. Risks and benefits discussed. All patient's questions answered.  )        Anesthesia Quick Evaluation  "

## 2024-07-07 ENCOUNTER — Encounter (HOSPITAL_COMMUNITY): Payer: Self-pay | Admitting: Obstetrics and Gynecology

## 2024-07-07 LAB — HEPATITIS B SURFACE ANTIGEN: Hepatitis B Surface Ag: NONREACTIVE

## 2024-07-07 MED ORDER — OXYCODONE HCL 5 MG PO TABS
5.0000 mg | ORAL_TABLET | Freq: Four times a day (QID) | ORAL | Status: DC | PRN
  Administered 2024-07-07: 5 mg via ORAL
  Filled 2024-07-07: qty 1

## 2024-07-07 MED ORDER — ACETAMINOPHEN 325 MG PO TABS
650.0000 mg | ORAL_TABLET | Freq: Four times a day (QID) | ORAL | Status: DC | PRN
  Administered 2024-07-07 – 2024-07-08 (×3): 650 mg via ORAL
  Filled 2024-07-07 (×3): qty 2

## 2024-07-07 MED ORDER — DIBUCAINE (PERIANAL) 1 % EX OINT: 1.0000 | TOPICAL_OINTMENT | CUTANEOUS | Status: DC | PRN

## 2024-07-07 MED ORDER — OXYCODONE HCL 5 MG PO TABS: 10.0000 mg | ORAL_TABLET | Freq: Four times a day (QID) | ORAL | Status: DC | PRN

## 2024-07-07 MED ORDER — LACTATED RINGERS AMNIOINFUSION: INTRAVENOUS | Status: DC

## 2024-07-07 MED ORDER — TETANUS-DIPHTH-ACELL PERTUSSIS 5-2-15.5 LF-MCG/0.5 IM SUSP: 0.5000 mL | Freq: Once | INTRAMUSCULAR | Status: DC

## 2024-07-07 MED ORDER — ONDANSETRON HCL 4 MG PO TABS: 4.0000 mg | ORAL_TABLET | ORAL | Status: DC | PRN

## 2024-07-07 MED ORDER — NIFEDIPINE ER OSMOTIC RELEASE 30 MG PO TB24
30.0000 mg | ORAL_TABLET | Freq: Every day | ORAL | Status: DC
  Administered 2024-07-08 – 2024-07-09 (×2): 30 mg via ORAL
  Filled 2024-07-07 (×3): qty 1

## 2024-07-07 MED ORDER — ONDANSETRON HCL 4 MG/2ML IJ SOLN: 4.0000 mg | INTRAMUSCULAR | Status: DC | PRN

## 2024-07-07 MED ORDER — PRENATAL MULTIVITAMIN CH
1.0000 | ORAL_TABLET | Freq: Every day | ORAL | Status: DC
  Administered 2024-07-07 – 2024-07-09 (×3): 1 via ORAL
  Filled 2024-07-07 (×3): qty 1

## 2024-07-07 MED ORDER — SENNOSIDES-DOCUSATE SODIUM 8.6-50 MG PO TABS
2.0000 | ORAL_TABLET | Freq: Every day | ORAL | Status: DC
  Administered 2024-07-08 – 2024-07-09 (×2): 2 via ORAL
  Filled 2024-07-07 (×2): qty 2

## 2024-07-07 MED ORDER — MEASLES, MUMPS & RUBELLA VAC ~~LOC~~ SUSR: 0.5000 mL | Freq: Once | SUBCUTANEOUS | Status: DC

## 2024-07-07 MED ORDER — SIMETHICONE 80 MG PO CHEW: 80.0000 mg | CHEWABLE_TABLET | ORAL | Status: DC | PRN

## 2024-07-07 MED ORDER — ZOLPIDEM TARTRATE 5 MG PO TABS: 5.0000 mg | ORAL_TABLET | Freq: Every evening | ORAL | Status: DC | PRN

## 2024-07-07 MED ORDER — DIPHENHYDRAMINE HCL 25 MG PO CAPS: 25.0000 mg | ORAL_CAPSULE | Freq: Four times a day (QID) | ORAL | Status: DC | PRN

## 2024-07-07 MED ORDER — BENZOCAINE-MENTHOL 20-0.5 % EX AERO
1.0000 | INHALATION_SPRAY | CUTANEOUS | Status: DC | PRN
  Administered 2024-07-07: 1 via TOPICAL
  Filled 2024-07-07: qty 56

## 2024-07-07 MED ORDER — WITCH HAZEL-GLYCERIN EX PADS: 1.0000 | MEDICATED_PAD | CUTANEOUS | Status: DC | PRN

## 2024-07-07 MED ORDER — IBUPROFEN 600 MG PO TABS
600.0000 mg | ORAL_TABLET | Freq: Four times a day (QID) | ORAL | Status: DC | PRN
  Administered 2024-07-07 – 2024-07-09 (×5): 600 mg via ORAL
  Filled 2024-07-07 (×5): qty 1

## 2024-07-07 MED ORDER — COCONUT OIL OIL
1.0000 | TOPICAL_OIL | Status: DC | PRN
  Administered 2024-07-09: 1 via TOPICAL

## 2024-07-07 NOTE — Anesthesia Postprocedure Evaluation (Signed)
"   Anesthesia Post Note  Patient: Tami Hall  Procedure(s) Performed: AN AD HOC LABOR EPIDURAL     Patient location during evaluation: Mother Baby Anesthesia Type: Epidural Level of consciousness: awake and alert Pain management: pain level controlled Vital Signs Assessment: post-procedure vital signs reviewed and stable Respiratory status: spontaneous breathing, nonlabored ventilation and respiratory function stable Cardiovascular status: stable Postop Assessment: no headache, no backache and epidural receding Anesthetic complications: no   No notable events documented.  Last Vitals:  Vitals:   07/07/24 0630 07/07/24 0746  BP: 135/77 119/72  Pulse: (!) 105 88  Resp: 16 16  Temp: 37 C   SpO2: 99% 100%    Last Pain:  Vitals:   07/07/24 0748  TempSrc:   PainSc: 6    Pain Goal:                Epidural/Spinal Function Cutaneous sensation: Tingles (07/07/24 0748), Patient able to flex knees: Yes (07/07/24 0748), Patient able to lift hips off bed: Yes (07/07/24 0748), Back pain beyond tenderness at insertion site: No (07/07/24 0748), Progressively worsening motor and/or sensory loss: No (07/07/24 0748), Bowel and/or bladder incontinence post epidural: No (07/07/24 0748)  [name]      "

## 2024-07-07 NOTE — Lactation Note (Signed)
 This note was copied from a baby's chart. Lactation Consultation Note  Patient Name: Tami Hall Unijb'd Date: 07/07/2024 Age:20 hours, P1  Reason for consult: Initial assessment;Primapara;1st time breastfeeding;Term Per mom last fed at 0850 40 ml of formula. Baby sound asleep.  LC reviewed breast feeding goals for 24 hours - feed with cues and by 3 hours. LC explained the size of a baby's belly at birth, and our goal is to gradually stretch it so the baby will take more per  feeding.  LC recommended to call with strong feeding cues and by 12 N ( which will be 3 hours ) to wake the baby up to feed .  LC reviewed supply and demand, importance of giving the baby practice at the breast.    Maternal Data Does the patient have breastfeeding experience prior to this delivery?: No  Feeding Mother's Current Feeding Choice: Breast Milk and Formula  LATCH Score   Interventions Interventions: Breast feeding basics reviewed;Education;LC Services brochure;CDC milk storage guidelines;CDC Guidelines for Breast Pump Cleaning  Discharge Pump: Hands Free;Personal  Consult Status Consult Status: Follow-up Date: 07/07/24 Follow-up type: In-patient    [name] 07/07/2024, 11:08 AM

## 2024-07-07 NOTE — Progress Notes (Signed)
 I had a finger stick from the cord blood gas needle The finger was milked I will check labs on the pt

## 2024-07-08 ENCOUNTER — Encounter (HOSPITAL_COMMUNITY): Payer: Self-pay | Admitting: Obstetrics and Gynecology

## 2024-07-08 LAB — RAPID HIV SCREEN (HIV 1/2 AB+AG)
HIV 1/2 Antibodies: NONREACTIVE
HIV-1 P24 Antigen - HIV24: NONREACTIVE

## 2024-07-08 LAB — CBC
HCT: 25.3 % — ABNORMAL LOW (ref 36.0–46.0)
Hemoglobin: 9.2 g/dL — ABNORMAL LOW (ref 12.0–15.0)
MCH: 28.7 pg (ref 26.0–34.0)
MCHC: 36.4 g/dL — ABNORMAL HIGH (ref 30.0–36.0)
MCV: 78.8 fL — ABNORMAL LOW (ref 80.0–100.0)
Platelets: 341 10*3/uL (ref 150–400)
RBC: 3.21 MIL/uL — ABNORMAL LOW (ref 3.87–5.11)
RDW: 13.8 % (ref 11.5–15.5)
WBC: 15.5 10*3/uL — ABNORMAL HIGH (ref 4.0–10.5)
nRBC: 0 % (ref 0.0–0.2)

## 2024-07-08 MED ORDER — POLYSACCHARIDE IRON COMPLEX 150 MG PO CAPS
150.0000 mg | ORAL_CAPSULE | Freq: Every day | ORAL | Status: DC
  Administered 2024-07-08 – 2024-07-09 (×2): 150 mg via ORAL
  Filled 2024-07-08 (×2): qty 1

## 2024-07-08 NOTE — Lactation Note (Signed)
 This note was copied from a baby's chart. Lactation Consultation Note  Patient Name: Tami Hall Date: 07/08/2024 Age:20 hours Reason for consult: Follow-up assessment;1st time breastfeeding;Term (weight gain of 0.76%, MOB would like to breastfeed infant, not latch infant to breast today has mostly been formula feeding infant.)  LC unable to assist with current latch due infant receiving 60 mls of 20 kcal formula prior to [facility] entering the room.  P1, MOB informed LC she has not latched infant to breast today has only been formula feeding infant but she does want to breast feed infant. LC asked MOB to call  [facility] services for infant's next latch and ask RN for latch assistance going forward. LC set MOB up with DEBP and MOB knows to pump every 3 hours for 15 minutes as she work towards latching infant at the breast. MOB was still pumping when LC left the room and had expressed 8 mls in the breast flanges, MOB was fitted with 18 mm breast flange.MOB knows to offer her own EBM first  that is pumped before supplementing infant with formula.   MOB knows that her EBM is safe for 4 hours at room temperature whereas RTF formula once open is only safe for 1 hour. [facility] sent referral for Outpatient [facility] clinic after hospital discharge for continue breastfeeding support and assistance.   Current feeding plan: Day 2  1- MOB will latch infant first for every feeding before supplement infant with formula,  Breastfeed infant by cues on demand, 8-12 times within 24 hours, skin to skin. 2- MOB will call for latch assistance. 3- MOB will continue to use the DEBP every 3 hours for 15 minutes, as she works towards infant latching at the breast to help stimulate and establish her milk supply. MOB given handout  Supplementing with Breastfeeding. 4- MOB will call [facility] service for latch assistance with the next feeding.     Maternal Data    Feeding Mother's Current Feeding Choice: Breast Milk and  Formula  LATCH Score                    Lactation Tools Discussed/Used Tools: Flanges;Pump Flange Size: 18 Pump Education: Setup, frequency, and cleaning;Milk Storage;Other (comment) Reason for Pumping: MOB wants to breastfeed but not been latching infant at the breast. Pumping frequency: MOB will continue to pump every 3 hours for 15 minutes on inital setting. Pumped volume: 8 mL (MOB had expressed 8 mls of colostrum and was still pumping when LC left the room.)  Interventions Interventions: Breast feeding basics reviewed;Skin to skin;Position options;Expressed milk;Education;LC Services brochure;CDC milk storage guidelines;CDC Guidelines for Breast Pump Cleaning;Guidelines for Milk Supply and Pumping Schedule Handout  Discharge Discharge Education: Outpatient Epic message sent [facility] sent referral for continued breast feeding support after hosptial discharge.) Pump: Hands Free;DEBP (Per MOB, she has MOMcosy at home.)  Consult Status Consult Status: Follow-up Date: 07/09/24 Follow-up type: In-patient    Grayce LULLA Batter 07/08/2024, 4:00 PM

## 2024-07-08 NOTE — Progress Notes (Signed)
 CSW received a notice from support staff that MOB wanted to meet with CSW. CSW met MOB at bedside to offer support. CSW entered the room, introduced herself and explained the reason for the visit. CSW observed MOB laying in bed as the infant was being held by a family member. MOB was polite, easy to engage, receptive to meeting with CSW, and appeared forthcoming.  MOB reported interested in [facility] support. CSW informed MOB that a referral for [facility] can be completed and she can follow in a few days with the documentation given; MOB was understanding. CSW has completed the [facility] referral and provided MOB with the follow up documentation as well as family connects first time moms resource guide for additional support.   CSW identifies no further need for intervention and no barriers to discharge at this time.  Rosina Molt, [place] Clinical Social Worker [phone]

## 2024-07-09 NOTE — Discharge Summary (Cosign Needed)
 OB Discharge Summary  Patient Name: Tami Hall DOB: 04-Jul-2005 MRN: [id number]  Date of admission: 07/05/2024 Delivering provider: ARMOND CAPE  Admitting diagnosis: Oligohydramnios [O41.00X0] Intrauterine pregnancy: [redacted]w[redacted]d     Secondary diagnosis: Patient Active Problem List   Diagnosis Date Noted   First degree perineal laceration 07/09/2024   Postpartum care following vaginal delivery 2/2 07/09/2024   SVD (spontaneous vaginal delivery) 07/08/2024   Elevated BP reading w/ no diagnosis of HTN 07/06/2024   Oligohydramnios 07/05/2024    Date of discharge: 07/09/2024   Discharge diagnosis: Principal Problem:   Postpartum care following vaginal delivery 2/2 Active Problems:   Oligohydramnios   Elevated BP reading w/ no diagnosis of HTN   SVD (spontaneous vaginal delivery)   First degree perineal laceration                                                            Augmentation: AROM and Pitocin  Pain control: Epidural Laceration:1st degree;Perineal Complications: None  Hospital course:  Induction of Labor With Vaginal Delivery   19 y.o. yo G1P1001 at [redacted]w[redacted]d was admitted to the hospital 07/05/2024 for induction of labor.  Indication for induction: oligohydramnios.  Patient had an labor course complicated by tight nuchal cord, cut on the perineum, mild shoulder dystocia, and Apgars of 0 and 6. She was diagnosed with preeclampsia without severe features. She did not receive magnesium sulfate postpartum and had normal blood pressures after delivery. On the day of discharge, she denies PEC symptoms and agrees to follow-up in the office in 1 week for a postpartum blood pressure check.  Membrane Rupture Time/Date: 10:55 AM,07/06/2024  Delivery Method:Vaginal, Spontaneous Operative Delivery:N/A Episiotomy: None Lacerations:  1st degree;Perineal Details of delivery can be found in separate delivery note.  Patient had a postpartum course complicated by . Patient is discharged home  07/09/24.  Newborn Data: Birth date:07/07/2024 Birth time:3:59 AM Gender:Female Living status:Living Apgars:0 ,6  Weight:3260 g  Physical exam  Vitals:   07/08/24 0621 07/08/24 1509 07/08/24 2048 07/09/24 0547  BP: 102/70 133/87 127/78 125/82  Pulse: 63 77 77 85  Resp: 17 18 18 18   Temp: 97.9 F (36.6 C) 98.6 F (37 C) 98.3 F (36.8 C) 98.2 F (36.8 C)  TempSrc: Oral Oral Oral Oral  SpO2:  99% 98% 100%  Weight:      Height:       General: alert and cooperative Lochia: appropriate Uterine Fundus: firm Perineum: repair intact, no edema DVT Evaluation: No evidence of DVT seen on physical exam.  Labs: Lab Results  Component Value Date   WBC 15.5 (H) 07/08/2024   HGB 9.2 (L) 07/08/2024   HCT 25.3 (L) 07/08/2024   MCV 78.8 (L) 07/08/2024   PLT 341 07/08/2024      Latest Ref Rng & Units 07/06/2024    3:04 PM  CMP  Glucose 70 - 99 mg/dL 86   BUN 6 - 20 mg/dL <5   Creatinine 9.55 - 1.00 mg/dL 9.24   Sodium 864 - 854 mmol/L 135   Potassium 3.5 - 5.1 mmol/L 4.1   Chloride 98 - 111 mmol/L 102   CO2 22 - 32 mmol/L 22   Calcium 8.9 - 10.3 mg/dL 8.9   Total Protein 6.5 - 8.1 g/dL 6.2   Total Bilirubin 0.0 - 1.2 mg/dL  0.5   Alkaline Phos 38 - 126 U/L 198   AST 15 - 41 U/L 24   ALT 0 - 44 U/L 10       07/08/2024    2:56 PM 07/07/2024    6:30 AM  Edinburgh Postnatal Depression Scale Screening Tool  I have been able to laugh and see the funny side of things. 0 --  I have looked forward with enjoyment to things. 0   I have blamed myself unnecessarily when things went wrong. 0   I have been anxious or worried for no good reason. 1   I have felt scared or panicky for no good reason. 0   Things have been getting on top of me. 0   I have been so unhappy that I have had difficulty sleeping. 0   I have felt sad or miserable. 1   I have been so unhappy that I have been crying. 0   The thought of harming myself has occurred to me. 0   Edinburgh Postnatal Depression Scale Total 2     Discharge instructions:  per After Visit Summary  After Visit Meds:  Allergies as of 07/09/2024   No Known Allergies      Medication List     STOP taking these medications    hydrocortisone  2.5 % ointment   loratadine  5 MG/5ML syrup Commonly known as: Claritin    ondansetron  4 MG disintegrating tablet Commonly known as: ZOFRAN -ODT   triamcinolone  0.025 % ointment Commonly known as: KENALOG        TAKE these medications    prenatal vitamin w/FE, FA 27-1 MG Tabs tablet Take 1 tablet by mouth daily at 12 noon.       Activity: Advance as tolerated. Pelvic rest for 6 weeks.   Newborn Data: Live born female  Birth Weight: 7 lb 3 oz (3260 g) APGAR: 0, 6  Newborn Delivery   Birth date/time: 07/07/2024 03:59:00 Delivery type: Vaginal, Spontaneous    Named Katelyn Baby Feeding: Bottle Disposition:home with mother  Delivery Report:  Review the Delivery Report for details.    Follow up:  Follow-up Information     [facility]. Schedule an appointment as soon as possible for a visit in 1 week(s).   Specialty: Obstetrics and Gynecology Why: For postpartum blood pressure check. Contact information: [address] [place]  [address] [phone]               Alan MARLA Molt, CNM, MSN 07/09/2024, 10:59 AM

## 2024-07-10 LAB — SURGICAL PATHOLOGY
